# Patient Record
Sex: Female | Born: 1970 | Race: Black or African American | Hispanic: No | Smoking: Former smoker
Health system: Southern US, Community
[De-identification: ages and names within clinical notes are randomized; demographics above are authoritative.]

## PROBLEM LIST (undated history)

## (undated) VITALS — BP 112/76 | HR 96 | Temp 98.0°F | Resp 16 | Ht 59.0 in | Wt 168.0 lb

## (undated) DIAGNOSIS — D649 Anemia, unspecified: Secondary | ICD-10-CM

## (undated) DIAGNOSIS — F419 Anxiety disorder, unspecified: Secondary | ICD-10-CM

## (undated) DIAGNOSIS — F4310 Post-traumatic stress disorder, unspecified: Secondary | ICD-10-CM

## (undated) DIAGNOSIS — I639 Cerebral infarction, unspecified: Secondary | ICD-10-CM

## (undated) DIAGNOSIS — F329 Major depressive disorder, single episode, unspecified: Secondary | ICD-10-CM

## (undated) DIAGNOSIS — I10 Essential (primary) hypertension: Secondary | ICD-10-CM

## (undated) DIAGNOSIS — F32A Depression, unspecified: Secondary | ICD-10-CM

## (undated) HISTORY — DX: Anemia, unspecified: D64.9

## (undated) HISTORY — PX: DILATION AND CURETTAGE OF UTERUS: SHX78

## (undated) HISTORY — PX: KNEE SURGERY: SHX244

## (undated) HISTORY — PX: WISDOM TOOTH EXTRACTION: SHX21

---
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
## 1998-05-06 ENCOUNTER — Other Ambulatory Visit: Admission: RE | Admit: 1998-05-06 | Discharge: 1998-05-06 | Payer: Self-pay | Admitting: *Deleted

## 1998-06-03 ENCOUNTER — Ambulatory Visit (HOSPITAL_COMMUNITY): Admission: RE | Admit: 1998-06-03 | Discharge: 1998-06-03 | Payer: Self-pay | Admitting: *Deleted

## 1998-06-30 ENCOUNTER — Inpatient Hospital Stay (HOSPITAL_COMMUNITY): Admission: AD | Admit: 1998-06-30 | Discharge: 1998-06-30 | Payer: Self-pay | Admitting: *Deleted

## 1998-09-27 ENCOUNTER — Inpatient Hospital Stay (HOSPITAL_COMMUNITY): Admission: AD | Admit: 1998-09-27 | Discharge: 1998-09-27 | Payer: Self-pay | Admitting: Obstetrics

## 1998-11-07 ENCOUNTER — Inpatient Hospital Stay (HOSPITAL_COMMUNITY): Admission: AD | Admit: 1998-11-07 | Discharge: 1998-11-07 | Payer: Self-pay | Admitting: *Deleted

## 1998-11-08 ENCOUNTER — Encounter: Payer: Self-pay | Admitting: *Deleted

## 1998-11-08 ENCOUNTER — Ambulatory Visit (HOSPITAL_COMMUNITY): Admission: RE | Admit: 1998-11-08 | Discharge: 1998-11-08 | Payer: Self-pay | Admitting: *Deleted

## 1998-11-13 ENCOUNTER — Inpatient Hospital Stay (HOSPITAL_COMMUNITY): Admission: AD | Admit: 1998-11-13 | Discharge: 1998-11-15 | Payer: Self-pay | Admitting: *Deleted

## 1998-12-15 ENCOUNTER — Emergency Department (HOSPITAL_COMMUNITY): Admission: EM | Admit: 1998-12-15 | Discharge: 1998-12-15 | Payer: Self-pay | Admitting: Emergency Medicine

## 1999-03-29 ENCOUNTER — Emergency Department (HOSPITAL_COMMUNITY): Admission: EM | Admit: 1999-03-29 | Discharge: 1999-03-29 | Payer: Self-pay | Admitting: Emergency Medicine

## 1999-10-21 ENCOUNTER — Inpatient Hospital Stay (HOSPITAL_COMMUNITY): Admission: AD | Admit: 1999-10-21 | Discharge: 1999-10-21 | Payer: Self-pay | Admitting: *Deleted

## 2000-08-08 ENCOUNTER — Inpatient Hospital Stay (HOSPITAL_COMMUNITY): Admission: AD | Admit: 2000-08-08 | Discharge: 2000-08-08 | Payer: Self-pay | Admitting: *Deleted

## 2001-10-08 ENCOUNTER — Emergency Department (HOSPITAL_COMMUNITY): Admission: EM | Admit: 2001-10-08 | Discharge: 2001-10-08 | Payer: Self-pay | Admitting: Emergency Medicine

## 2002-01-24 ENCOUNTER — Emergency Department (HOSPITAL_COMMUNITY): Admission: EM | Admit: 2002-01-24 | Discharge: 2002-01-24 | Payer: Self-pay

## 2002-12-15 ENCOUNTER — Emergency Department (HOSPITAL_COMMUNITY): Admission: EM | Admit: 2002-12-15 | Discharge: 2002-12-15 | Payer: Self-pay | Admitting: Emergency Medicine

## 2002-12-26 ENCOUNTER — Inpatient Hospital Stay (HOSPITAL_COMMUNITY): Admission: AD | Admit: 2002-12-26 | Discharge: 2002-12-26 | Payer: Self-pay | Admitting: Obstetrics

## 2003-01-17 ENCOUNTER — Other Ambulatory Visit: Admission: RE | Admit: 2003-01-17 | Discharge: 2003-01-17 | Payer: Self-pay | Admitting: *Deleted

## 2003-04-11 ENCOUNTER — Inpatient Hospital Stay (HOSPITAL_COMMUNITY): Admission: AD | Admit: 2003-04-11 | Discharge: 2003-04-13 | Payer: Self-pay | Admitting: *Deleted

## 2003-04-12 ENCOUNTER — Encounter (INDEPENDENT_AMBULATORY_CARE_PROVIDER_SITE_OTHER): Payer: Self-pay

## 2003-06-07 ENCOUNTER — Ambulatory Visit (HOSPITAL_COMMUNITY): Admission: RE | Admit: 2003-06-07 | Discharge: 2003-06-07 | Payer: Self-pay | Admitting: Urology

## 2003-06-07 ENCOUNTER — Encounter: Payer: Self-pay | Admitting: Urology

## 2003-07-20 ENCOUNTER — Ambulatory Visit (HOSPITAL_BASED_OUTPATIENT_CLINIC_OR_DEPARTMENT_OTHER): Admission: RE | Admit: 2003-07-20 | Discharge: 2003-07-20 | Payer: Self-pay | Admitting: Urology

## 2003-07-20 ENCOUNTER — Encounter (INDEPENDENT_AMBULATORY_CARE_PROVIDER_SITE_OTHER): Payer: Self-pay | Admitting: Specialist

## 2003-08-18 ENCOUNTER — Emergency Department (HOSPITAL_COMMUNITY): Admission: EM | Admit: 2003-08-18 | Discharge: 2003-08-18 | Payer: Self-pay

## 2004-02-01 ENCOUNTER — Emergency Department (HOSPITAL_COMMUNITY): Admission: EM | Admit: 2004-02-01 | Discharge: 2004-02-01 | Payer: Self-pay | Admitting: Emergency Medicine

## 2004-02-12 ENCOUNTER — Inpatient Hospital Stay (HOSPITAL_COMMUNITY): Admission: AD | Admit: 2004-02-12 | Discharge: 2004-02-12 | Payer: Self-pay | Admitting: *Deleted

## 2004-02-15 ENCOUNTER — Other Ambulatory Visit: Admission: RE | Admit: 2004-02-15 | Discharge: 2004-02-15 | Payer: Self-pay | Admitting: Obstetrics and Gynecology

## 2004-06-25 ENCOUNTER — Inpatient Hospital Stay (HOSPITAL_COMMUNITY): Admission: AD | Admit: 2004-06-25 | Discharge: 2004-06-25 | Payer: Self-pay | Admitting: Obstetrics and Gynecology

## 2004-07-19 ENCOUNTER — Inpatient Hospital Stay (HOSPITAL_COMMUNITY): Admission: AD | Admit: 2004-07-19 | Discharge: 2004-07-19 | Payer: Self-pay | Admitting: Obstetrics and Gynecology

## 2004-08-03 ENCOUNTER — Inpatient Hospital Stay (HOSPITAL_COMMUNITY): Admission: AD | Admit: 2004-08-03 | Discharge: 2004-08-05 | Payer: Self-pay | Admitting: Obstetrics and Gynecology

## 2004-08-17 ENCOUNTER — Inpatient Hospital Stay (HOSPITAL_COMMUNITY): Admission: AD | Admit: 2004-08-17 | Discharge: 2004-08-17 | Payer: Self-pay | Admitting: Obstetrics & Gynecology

## 2004-09-01 ENCOUNTER — Inpatient Hospital Stay (HOSPITAL_COMMUNITY): Admission: AD | Admit: 2004-09-01 | Discharge: 2004-09-01 | Payer: Self-pay | Admitting: Obstetrics & Gynecology

## 2004-09-04 ENCOUNTER — Observation Stay (HOSPITAL_COMMUNITY): Admission: AD | Admit: 2004-09-04 | Discharge: 2004-09-05 | Payer: Self-pay | Admitting: Obstetrics & Gynecology

## 2004-09-08 ENCOUNTER — Inpatient Hospital Stay (HOSPITAL_COMMUNITY): Admission: AD | Admit: 2004-09-08 | Discharge: 2004-09-12 | Payer: Self-pay | Admitting: Obstetrics

## 2004-09-16 ENCOUNTER — Inpatient Hospital Stay (HOSPITAL_COMMUNITY): Admission: AD | Admit: 2004-09-16 | Discharge: 2004-09-20 | Payer: Self-pay | Admitting: Obstetrics

## 2004-09-22 ENCOUNTER — Inpatient Hospital Stay (HOSPITAL_COMMUNITY): Admission: EM | Admit: 2004-09-22 | Discharge: 2004-09-26 | Payer: Self-pay | Admitting: Emergency Medicine

## 2004-09-22 ENCOUNTER — Ambulatory Visit: Payer: Self-pay | Admitting: Pulmonary Disease

## 2004-09-24 ENCOUNTER — Encounter (INDEPENDENT_AMBULATORY_CARE_PROVIDER_SITE_OTHER): Payer: Self-pay | Admitting: *Deleted

## 2004-09-24 ENCOUNTER — Encounter: Payer: Self-pay | Admitting: Cardiology

## 2004-10-23 ENCOUNTER — Ambulatory Visit: Payer: Self-pay | Admitting: Internal Medicine

## 2004-12-15 ENCOUNTER — Emergency Department (HOSPITAL_COMMUNITY): Admission: EM | Admit: 2004-12-15 | Discharge: 2004-12-15 | Payer: Self-pay | Admitting: Emergency Medicine

## 2005-03-15 ENCOUNTER — Emergency Department (HOSPITAL_COMMUNITY): Admission: EM | Admit: 2005-03-15 | Discharge: 2005-03-15 | Payer: Self-pay | Admitting: Emergency Medicine

## 2005-03-25 IMAGING — CR DG CHEST 2V
2 series · 2 of 2 positions shown · non-contrast
Comparison: none

CLINICAL DATA: Shortness of breath.  Mastitis.  
 PA AND LATERAL CHEST, 09/17/04
 There is cardiomegaly.  There is a consolidative infiltrate or focal pulmonary edema at the right lung base.  There is no discrete effusion.  
 There is a mild thoracic scoliosis.  
 IMPRESSION
 There is an infiltrate at the right base, most likely pneumonia.  However, the patient does have borderline cardiomegaly but focal pulmonary edema could give a similar appearance although I think this is less likely.

[view not recorded (1 of 2)]
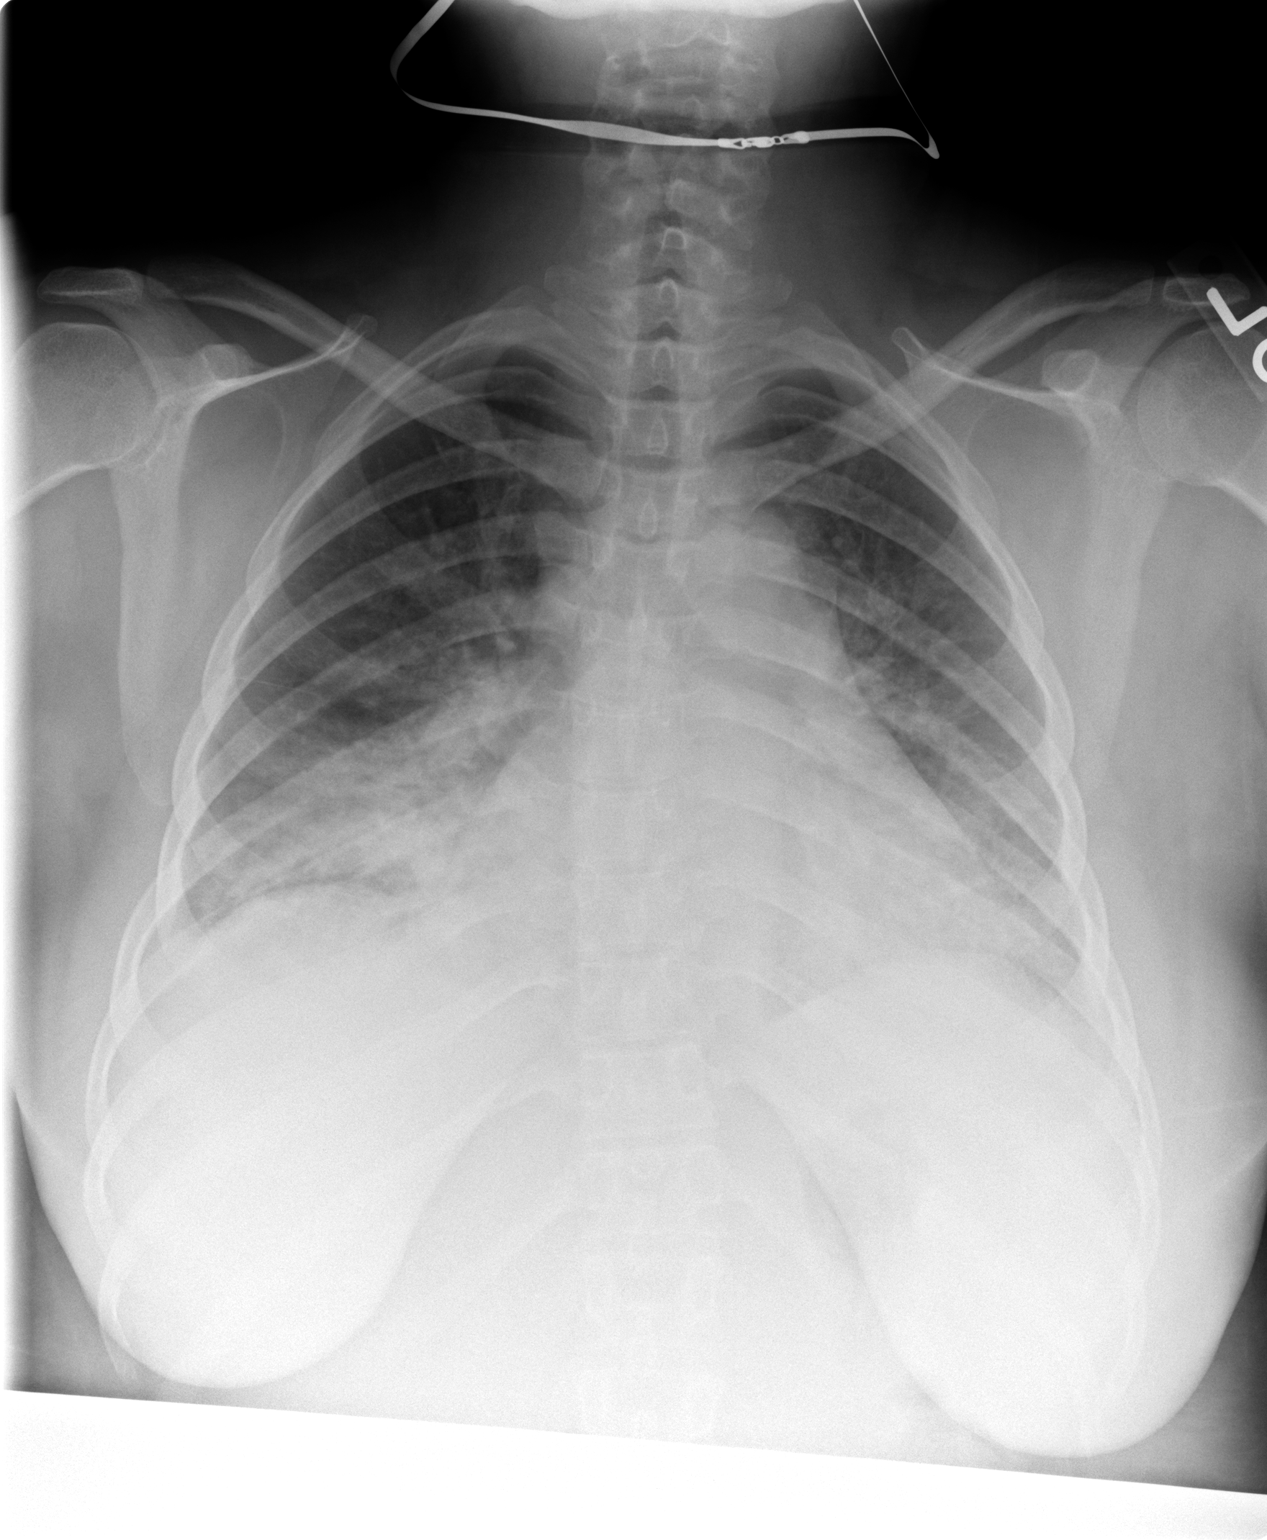

[view not recorded (2 of 2)]
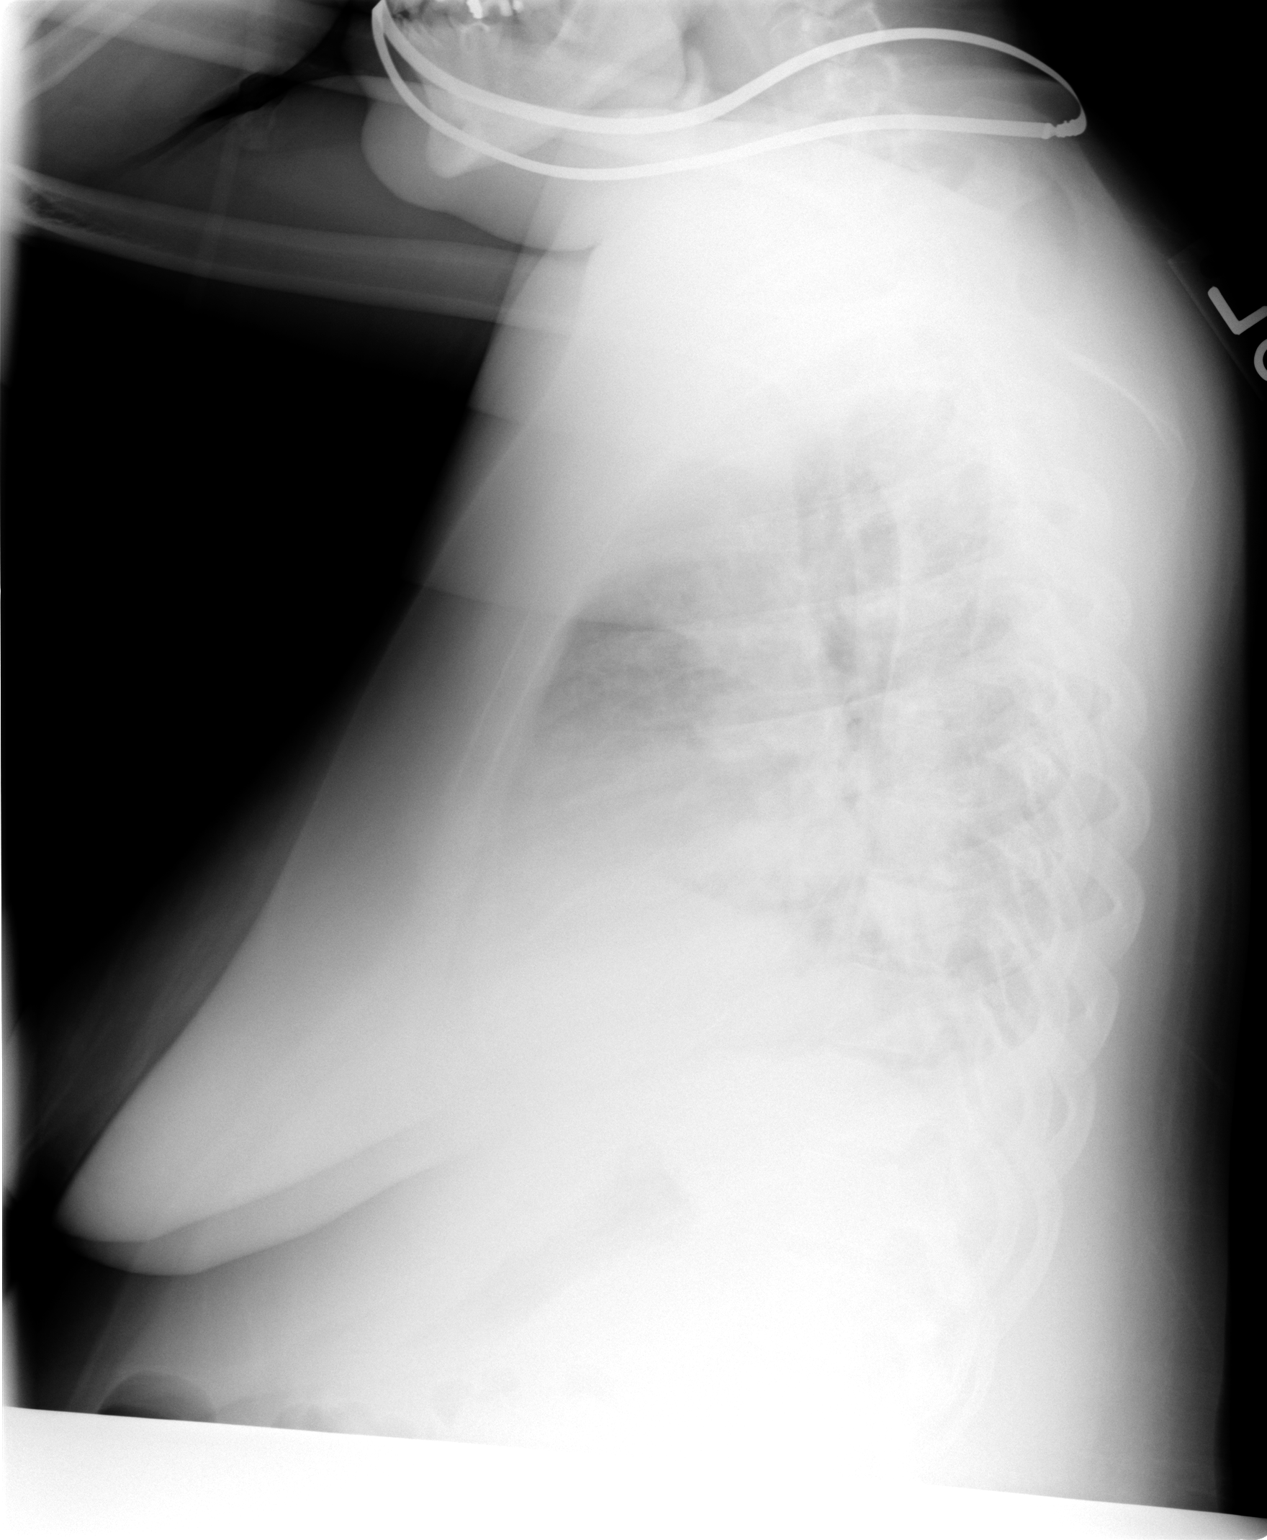

[2 of 2 positions shown; findings below may reference images not displayed]

## 2005-03-27 IMAGING — CR DG CHEST 2V
2 series · 2 of 2 positions shown · non-contrast
Comparison: 09/17/04.

CLINICAL DATA: Follow-up pulmonary edema.
 CHEST TWO VIEWS

[view not recorded (1 of 2)]
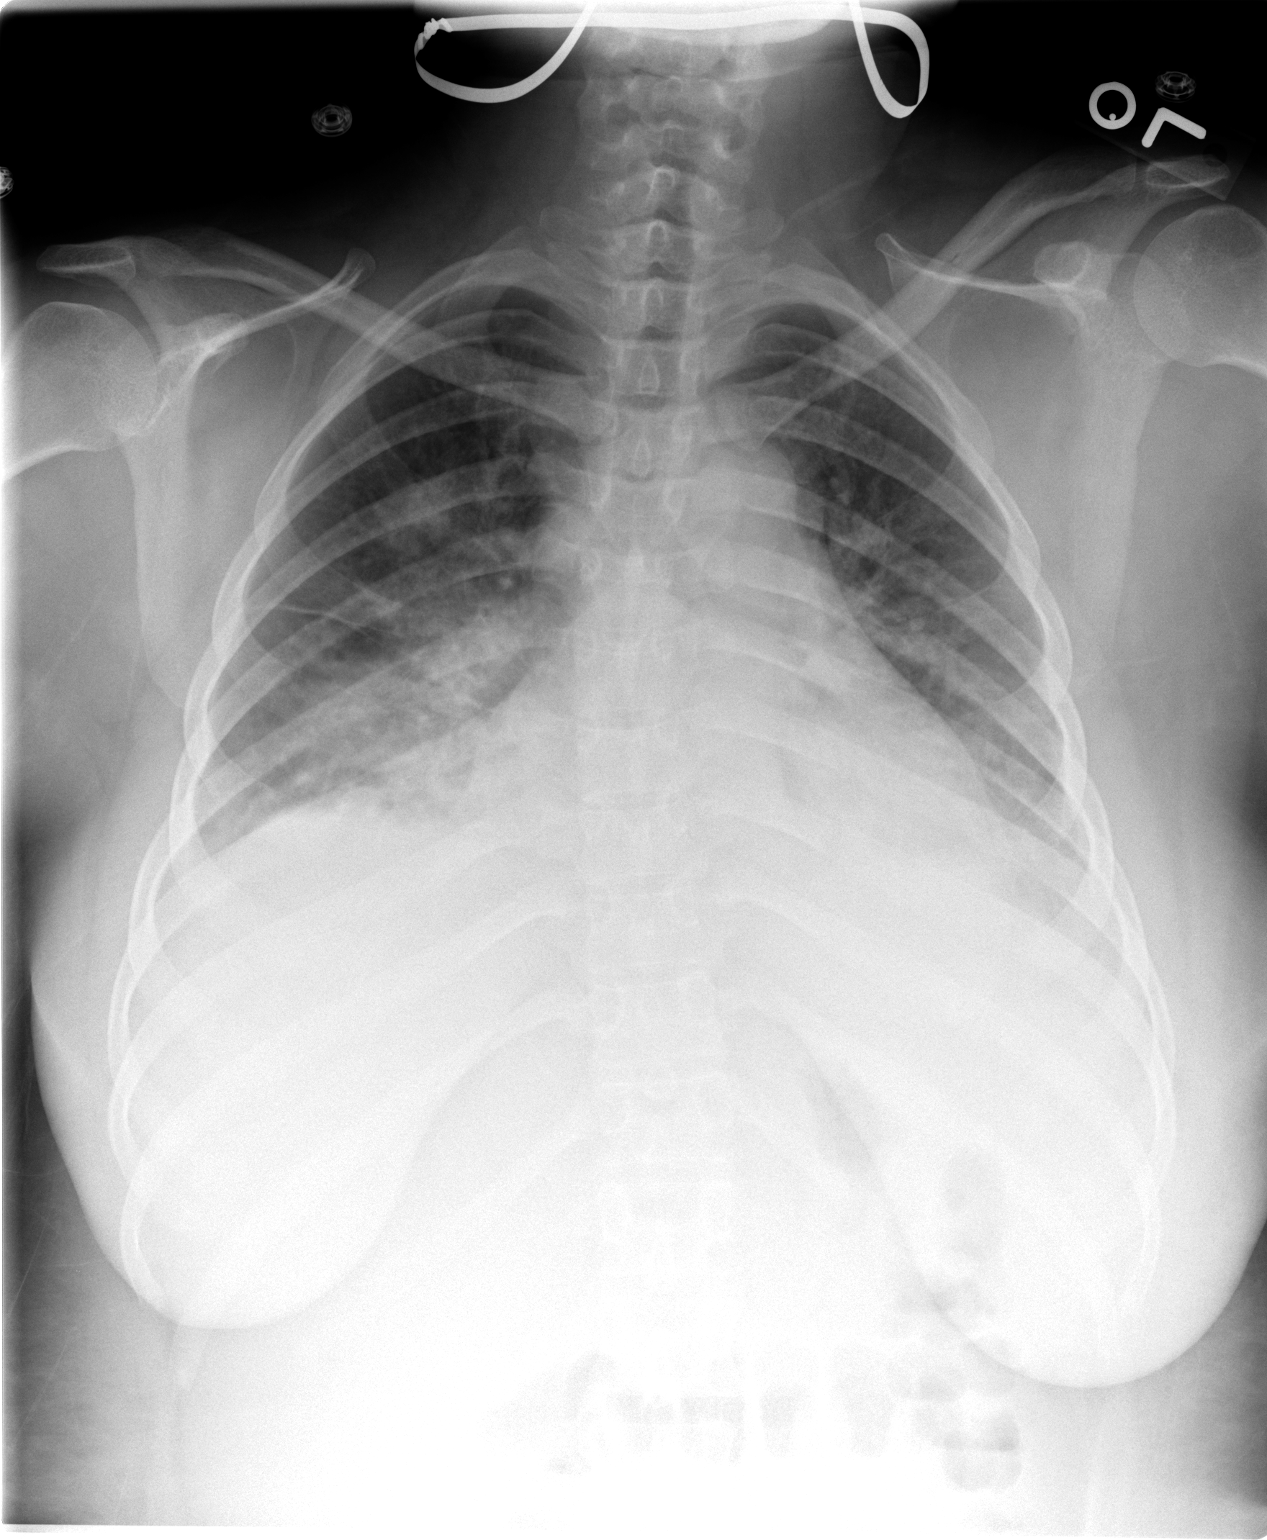

[view not recorded (2 of 2)]
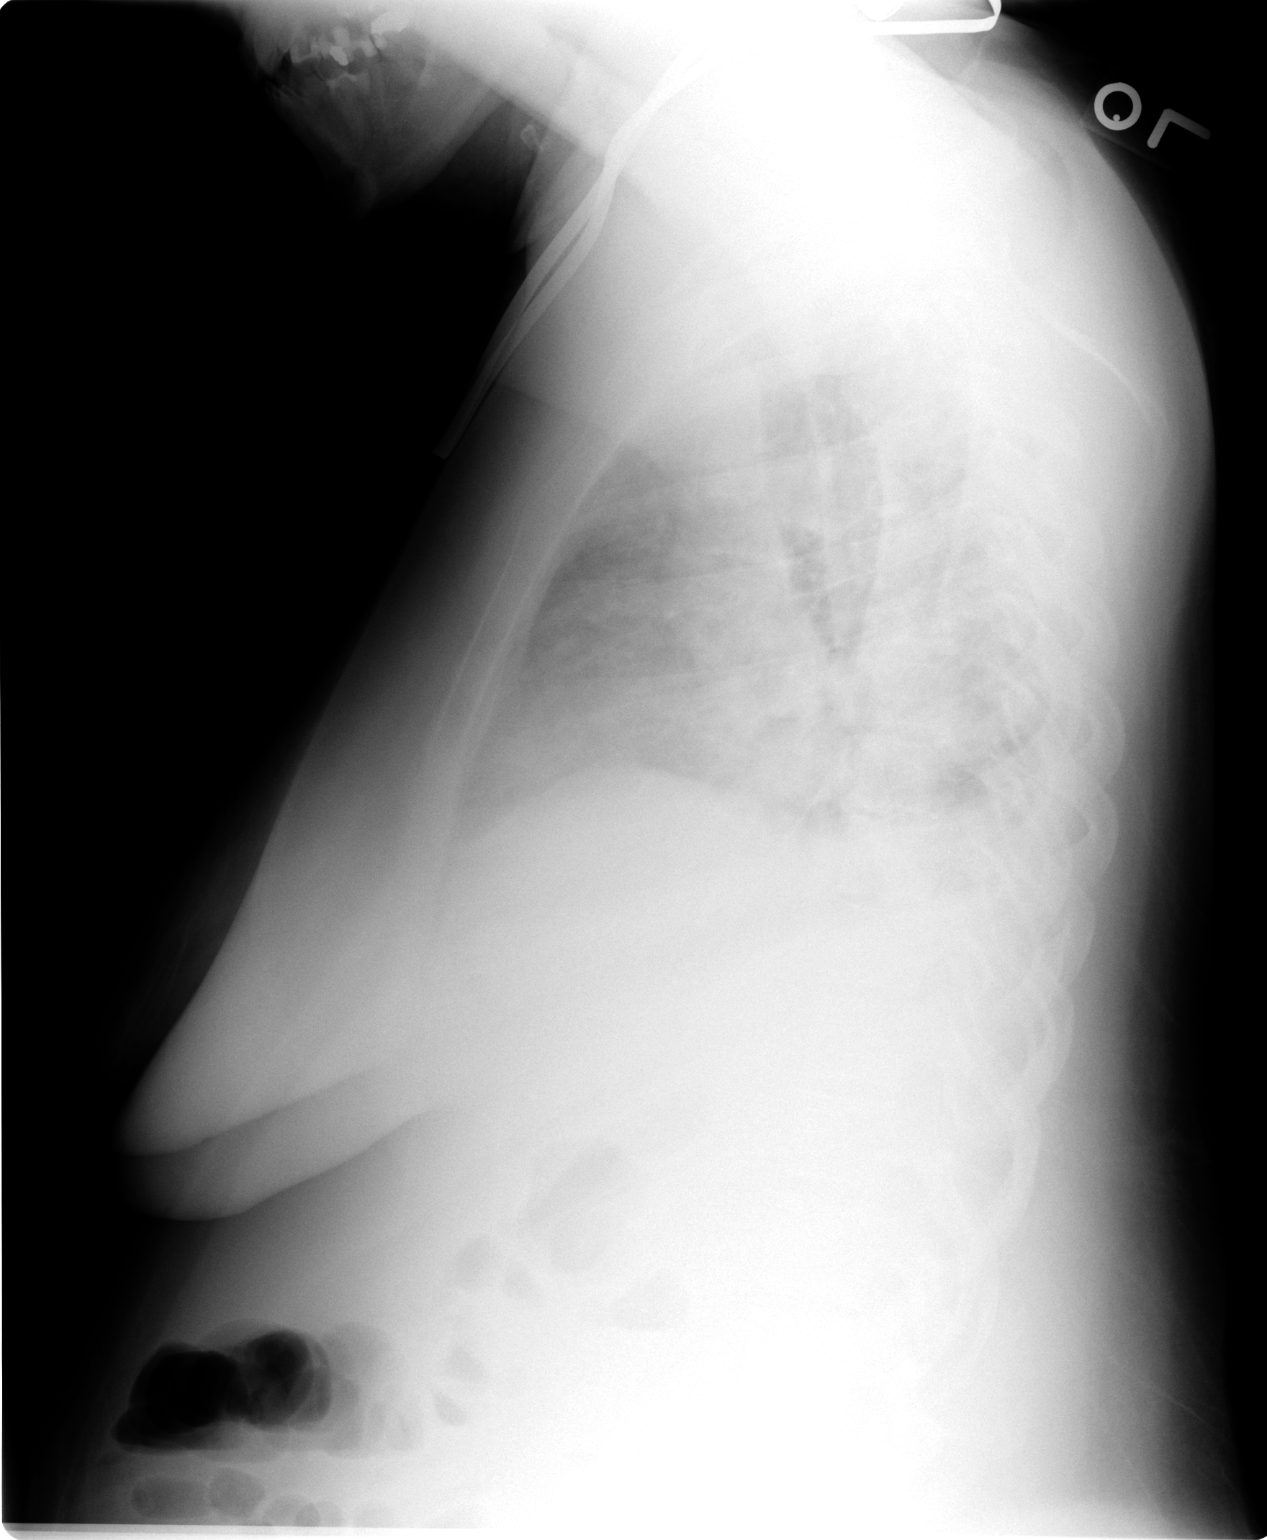

[2 of 2 positions shown; findings below may reference images not displayed]

The patient continues to have bilateral air space density within the lungs primarily affecting the lower lobes but with some involvement of the upper lobes.  When compared to the films of two days ago, there is little change.  There is a tiny amount of pleural fluid.  No new abnormalities are seen.
IMPRESSION: Little radiographic change in bilateral air space density that could be due to pneumonia or edema.

## 2006-08-25 ENCOUNTER — Emergency Department (HOSPITAL_COMMUNITY): Admission: EM | Admit: 2006-08-25 | Discharge: 2006-08-25 | Payer: Self-pay | Admitting: Emergency Medicine

## 2007-07-18 ENCOUNTER — Encounter: Admission: RE | Admit: 2007-07-18 | Discharge: 2007-07-18 | Payer: Self-pay | Admitting: Obstetrics & Gynecology

## 2008-01-20 ENCOUNTER — Emergency Department (HOSPITAL_COMMUNITY): Admission: EM | Admit: 2008-01-20 | Discharge: 2008-01-20 | Payer: Self-pay | Admitting: Emergency Medicine

## 2008-11-07 ENCOUNTER — Emergency Department (HOSPITAL_COMMUNITY): Admission: EM | Admit: 2008-11-07 | Discharge: 2008-11-07 | Payer: Self-pay | Admitting: Emergency Medicine

## 2009-04-09 ENCOUNTER — Emergency Department (HOSPITAL_COMMUNITY): Admission: EM | Admit: 2009-04-09 | Discharge: 2009-04-09 | Payer: Self-pay | Admitting: Emergency Medicine

## 2010-12-14 ENCOUNTER — Encounter: Payer: Self-pay | Admitting: Obstetrics & Gynecology

## 2011-04-10 NOTE — Op Note (Signed)
[name], [name]           ACCOUNT NO.:  [id number]   MEDICAL RECORD NO.:  [id number]          PATIENT TYPE:  INP   LOCATION:  0476                         FACILITY:  [facility]   PHYSICIAN:  Sharlet Salina T. Hoxworth, M.D.DATE OF BIRTH:  01/15/1971   DATE OF PROCEDURE:  09/23/2004  DATE OF DISCHARGE:                                 OPERATIVE REPORT   PREOPERATIVE DIAGNOSES:  Cholelithiasis and cholecystitis.   POSTOPERATIVE DIAGNOSES:  Cholelithiasis and cholecystitis.   SURGICAL PROCEDURE:  Laparoscopic cholecystectomy with intraoperative  cholangiogram.   SURGEON:  Dr. Johna Sheriff   ANESTHESIA:  General.   BRIEF HISTORY:  [name] is a 40 year old black female,  approximately 2 weeks postpartum.  She has known gallstones discovered  during pregnancy.  She presents with persistent flank and back pain, nausea,  and significant tenderness in the right upper quadrant.  She has mildly  elevated LFTs.  She is felt to likely have ongoing cholecystitis, and  laparoscopic cholecystectomy with cholangiogram has been recommended and  accepted.  The nature of the procedure, indications, risks of bleeding,  infection, bile leak, and bile duct injury were discussed and understood.  She is now brought to the operating room for this procedure.   DESCRIPTION OF OPERATION:  The patient brought to the operating room and  placed in the supine position on the operating table, and general  endotracheal anesthesia was induced.  She was already on preoperative  antibiotics.  PAS were in place.  The abdomen was widely sterilely prepped  and draped.  Local anesthesia was used to infiltrate the trocar sites prior  to the incisions.  A 1 cm incision was made at the umbilicus and dissection  carried down to the midline fascia which was sharply incised for 1 cm and  the peritoneum entered under direct vision.  Through a mattress suture of 0  Vicryl, the Hasson trocar was placed and pneumoperitoneum  established.  Under direct vision, a 10 mm trocar was placed in the subxiphoid area and  two 5 mm trocars were on the right subcostal margin.  The gallbladder was  thickened and edematous with some degree of acute inflammation as well.  The  fundus was grasped and elevated up over the liver and the infundibulum  retracted inferolaterally.  Peritoneum anterior and posterior to Calot's  triangle was incised.  The peritoneum was quite thickened.  Fibrofatty  tissue was stripped off the neck of the gallbladder toward the porta  hepatis.  The cystic artery was identified coursing up on the gallbladder  wall and was divided between two proximal and one distal clip.  The cystic  duct gallbladder junction was dissected 360 degrees and the cystic duct  dissected out over about 1 cm.  The cystic duct was then clamped at the  gallbladder junction and operative cholangiogram obtained through the cystic  duct.  This showed good filling of a normal size common bile duct and  intrahepatic ducts with free flow into the duodenum and no filling defects.  Following this, the Cholangiocath was removed, and the cystic duct was  triply clipped  proximally and divided.  The  gallbladder was then dissected  free from its bed using hook cautery and removed through the umbilicus.  The  right upper quadrant was thoroughly irrigated and suctioned until clear and  complete hemostasis obtained in the gallbladder bed.  Trocars were removed  under direct vision.  All CO2 evacuated from the peritoneal cavity.  The  mattress suture was secured  at the umbilicus.  Skin incisions were closed with interrupted subcuticular  4-0 Monocryl and Steri-Strips.  Sponge, needle, and instrument counts were  correct.  Dry sterile dressings were applied and the patient taken to  recovery in good condition.      BTH/MEDQ  D:  09/23/2004  T:  09/23/2004  Job:  [id number]
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
## 2011-04-10 NOTE — Discharge Summary (Signed)
[name], [name]           ACCOUNT NO.:  [id number]   MEDICAL RECORD NO.:  [id number]          PATIENT TYPE:  INP   LOCATION:  9175                          [facility]ACILITY:  [facility]   PHYSICIAN:  James A. [name], M.D.DATE OF BIRTH:  [birth year]   DATE OF ADMISSION:  08/03/2004  DATE OF DISCHARGE:  08/05/2004                                 DISCHARGE SUMMARY   DISCHARGE DIAGNOSES:  1.  Intrauterine pregnancy at [redacted] weeks gestation.  2.  Cholelithiasis.  3.  Preterm labor versus irritability - lysed.  4.  Elevated liver enzymes - probably secondary to #2, improving.   CONSULTATIONS:  None.   OPERATIONS AND SPECIAL PROCEDURES:  None.   DISCHARGE MEDICATIONS:  1.  Terbutaline 2.5 mg q.6h.  2.  Percocet one q.4h. p.r.n.   HISTORY AND PHYSICAL:  This is a 40 year old gravida 5 para 3 AB 1 at [redacted]  weeks gestation at the time of presentation.  She presented to maternity  admissions at [facility] complaining of abdominal discomfort and  pruritus.  She was evaluated by Dr. Lily Peer.  Initial diagnosis was  intrahepatic cholestasis.  For the remainder of the history and physical  please see chart.   HOSPITAL COURSE:  The patient was admitted to [facility]  [place].  She was given p.o. terbutaline.  Admission laboratory studies  were drawn.  Amylase and lipase returned as normal.  Biophysical profile was  obtained and the value was 8 out of a possible 8.  The SGOT and SGPT were  elevated at 157 and 240 respectively.  On August 04, 2004 an ultrasound  was ordered which revealed a single gallstone without any biliary tract  dilatation or apparent obstruction.  The SGOT diminished to 90, then 49.  The SGPT diminished to 192, then 130.   At this point the patient was felt to have an adequate diagnosis, and with  improvement of the liver enzymes she was felt to be a stable candidate for  discharge.  Hence, she was discharged home August 05, 2004 afebrile and  in  satisfactory condition.   DISPOSITION:  The patient is to return to [facility]  in several days for continued antenatal care.     JAM/MEDQ  D:  08/20/2004  T:  08/21/2004  Job:  [id number]
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
## 2011-04-10 NOTE — Discharge Summary (Signed)
[name], [name]           ACCOUNT NO.:  [id number]   MEDICAL RECORD NO.:  [id number]          PATIENT TYPE:  INP   LOCATION:  9317                          [facility]ACILITY:  [facility]   PHYSICIAN:  Charles A. Clearance Coots, M.D.DATE OF BIRTH:  Aug 24, 1971   DATE OF ADMISSION:  09/16/2004  DATE OF DISCHARGE:                                 DISCHARGE SUMMARY   ADMITTING DIAGNOSIS:  Bilateral mastitis.   DISCHARGE DIAGNOSIS:  Bilateral mastitis, improved after intravenous  antibiotic therapy.   REASON FOR ADMISSION:  A 40 year old black female G5 P4 status post normal  spontaneous vaginal delivery on September 10, 2004 presented to triage at  [facility] with a complaint of right shoulder pain and pain under both  arms and breasts.  The patient has had some difficulty with breastfeeding at  home and noticed increased breast tenderness.  She thought she also felt  some fever at home.   PAST MEDICAL HISTORY:  Surgery:  Leg surgery, knee surgery, and wisdom  teeth.  Illnesses: Gallstones, anemia.   MEDICATIONS:  Prenatal vitamins, iron, ibuprofen.   ALLERGIES:  No known drug allergies.   SOCIAL HISTORY:  Negative for tobacco, alcohol, or recreational drug use.   FAMILY HISTORY:  Noncontributory.   PHYSICAL EXAMINATION:  GENERAL:  Well-nourished, well-developed black female  in no acute distress.  VITAL SIGNS:  Temperature 98.7, pulse 76, respiratory rate 18, blood  pressure 139/81.  HEENT:  Normal.  NECK:  Supple without adenopathy.  LUNGS:  Clear to auscultation bilaterally.  HEART:  Regular rate and rhythm.  BREASTS:  Tender bilaterally, warm, and erythema and some swelling in both  axillary areas, with positive axillary or either breast ductal swelling.  ABDOMEN:  Soft, nontender.  PELVIC:  Omitted.  EXTREMITIES:  With 1-2+ pedal edema.   IMPRESSION:  Bilateral mastitis.   PLAN:  Admit, IV antibiotic treatment, and lactation consultation.   LABORATORY VALUES:  Urinalysis  within normal limits.  Comprehensive  metabolic panel within normal limits.  Hemoglobin 9.8, hematocrit 29.8.   HOSPITAL COURSE:  The patient was admitted and started on IV Unasyn,  responded quite well to Unasyn therapy after 48 hours.  Breasts were softer  and less tender after 48 hours and she had been afebrile throughout the  whole course of hospital admission.  On hospital day #2, the patient  complained of some shortness of breath.  However, the lungs were clear to  auscultation initially and chest x-ray was obtained which revealed some  cardiomegaly and findings consistent with pneumonia being a right lower lobe  infiltrate.  Azithromycin was added to the antibiotic regimen and the  patient responded well.  She developed some decreased breath sounds in both  lung bases over the next 24 hours, however, and developed some lower  extremity edema.  Pulmonary medicine consultation was obtained and  recommendations were made.  The patient was much improved by hospital day  #4, and was discharged home on hospital day #4 much improved.   DISCHARGE LABORATORY VALUES:  Comprehensive metabolic panel was within  normal limits.  Hemoglobin 10.4, hematocrit 31.4.  Erythrocyte sedimentation  rate  was elevated at 54.  CK-MB and troponin I labs were within normal  limits with indication of myocardial injury.  EKG was within normal limits  except for some sinus tachycardia and nonspecific T wave changes.   DISCHARGE DISPOSITION:  1.  Medications:  Continue prenatal vitamins, Ceftin 500 mg twice a day.  2.  The patient is to follow up in the office in 2 weeks.     Char   CAH/MEDQ  D:  09/20/2004  T:  09/20/2004  Job:  [id number]

## 2011-04-10 NOTE — H&P (Signed)
[name], [name]                     ACCOUNT NO.:  [id number]   MEDICAL RECORD NO.:  [id number]                   PATIENT TYPE:  INP   LOCATION:  9175                                 FACILITY:  [facility]   PHYSICIAN:  Juan H. Lily Peer, M.D.             DATE OF BIRTH:  08-18-1971   DATE OF ADMISSION:  08/03/2004  DATE OF DISCHARGE:                                HISTORY & PHYSICAL   ADDENDUM:  She is a 40 year old, gravida 5, para 3, AB1, currently 32 weeks'  gestation who was being evaluated in the emergency room for pruritus and  abdominal discomfort.  She had received a shot of subcutaneous terbutaline  0.25 mg at [facility] where she had presented earlier and  transferred to [facility].  She was given IV fluids of lactated  Ringer's.  She appeared to have uterine irritability, and her abdominal  discomfort improved.  When I came back to see her, she was having a  significant amount of abdominal discomfort.  When the belt was repositioned,  she had uterine irritability interchanged with some mild contractions.  Perhaps all the symptoms that she has been experiencing, which she states  has been going on for several weeks, could be underlying premature  contractions.  For this reason, we will admit her to the hospital and keep  her on terbutaline 2.5 mg p.o. q.4 h. and interestingly, the report from her  comprehensive metabolic panel had demonstrated normal electrolytes, but her  SGOT and SGPT were elevated (SGOT was 157, and SGPT was elevated at 240).  Alkaline phosphatase 143, but her total bilirubin was normal at 0.6.  Her  CBC demonstrated a white blood cell count of 9.8, hemoglobin 10.1,  hematocrit 30.3, with a platelet count of 307,000.  She did have an  ultrasound with size consistent with her dates, no abnormalities, and the  placenta detected.  Normal AFI, cervical length 3.4 cm.  GC and Chlamydia  culture and group B Strep are pending at the time of this  dictation.   WORKING DIAGNOSIS:  The possibility of benign intrahepatic cholestasis of  pregnancy.  With her presenting symptoms, there is doubt that this could be  fatty liver pregnancy.  Her blood sugar was normal at 107, and no other  abnormality noted, and no evidence of HELLP syndrome.  Her blood pressure  108/68, pulse 117, respiration 12.   PLAN:  Will go ahead and admit her and observe her on a monitor throughout  the night. Will keep on terbutaline 2.5 mg p.o. q.4 h.  I am currently  checking a serum lipase and amylase.  Will go ahead and repeat same  laboratory tests of CBC, CMET, lipase, and amylase in the morning.  Will  give her Benadryl 25 mg p.o. b.i.d. for her pruritus and calamine or  Benadryl lotion to apply topical p.r.n.  Of note, the patient's discomfort  is more lower abdominal than upper  abdominal region, and there was no  rebound or guarding, and her previous pregnancies were all delivered  vaginally.  Beside the above-mentioned tests, we will also run an acute  hepatitis panel as well.  All of these issues were discussed with the  patient, and we will monitor closely overnight along with her laboratory  tests as well.  As a last resort, if her pruritus becomes more significant,  there is a possibility she could be placed on Actigall 500 mg b.i.d. which  is a Category B medication, but we will wait for her clinical course over  the next 24-48 hours.  Of note overall, the fetal heart rate tracing has  been re-assuring as well.                                               Juan H. Lily Peer, M.D.    JHF/MEDQ  D:  08/03/2004  T:  08/03/2004  Job:  [id number]
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
## 2011-04-10 NOTE — Discharge Summary (Signed)
[name], [name]           ACCOUNT NO.:  [id number]   MEDICAL RECORD NO.:  [id number]          PATIENT TYPE:  INP   LOCATION:  0476                         FACILITY:  [facility]   PHYSICIAN:  Sharlet Salina T. Hoxworth, M.D.DATE OF BIRTH:  05-10-1971   DATE OF ADMISSION:  09/22/2004  DATE OF DISCHARGE:  09/26/2004                                 DISCHARGE SUMMARY   DISCHARGE DIAGNOSES:  1.  Cholelithiasis and cholecystitis.  2.  Hypoxia and pulmonary infiltrates, question ruptured capillary syndrome      postpartum.   HISTORY OF PRESENT ILLNESS:  This patient is a 40 year old black female 12  days postpartum from a vaginal delivery   Dictation ended at this point.      BTH/MEDQ  D:  10/07/2004  T:  10/08/2004  Job:  [id number]

## 2011-04-10 NOTE — Op Note (Signed)
   NAMEKATHLEEN, [name]                       ACCOUNT NO.:  [id number]   MEDICAL RECORD NO.:  [id number]                   PATIENT TYPE:  AMB   LOCATION:  NESC                                 [facility]   PHYSICIAN:  Lindaann Slough, M.D.               DATE OF BIRTH:  1971/07/16   DATE OF PROCEDURE:  07/20/2003  DATE OF DISCHARGE:                                 OPERATIVE REPORT   PREOPERATIVE DIAGNOSIS:  Suburethral cyst (Bartholin's gland cyst).   POSTOPERATIVE DIAGNOSIS:  Suburethral cyst (Bartholin's gland cyst).   PROCEDURE:  Excision of suburethral cyst.   SURGEON:  Lindaann Slough, M.D.   ANESTHESIA:  General.   INDICATIONS FOR PROCEDURE:  The patient is a 40 year old female who was  found by her gynecologist to have a large suburethral cyst. She was referred  to me for further evaluation. VCUG showed no urethral diverticulum. The  patient is scheduled for excision of the cyst.   DESCRIPTION OF PROCEDURE:  Under general anesthesia, the patient was prepped  and draped and placed in the dorsal lithotomy position. A #16 Foley catheter  was inserted in the bladder. The labia were approximated to the inner thighs  with #2-0 silk and then the vaginal mucosa was infiltrated with 0.5%  Marcaine with epinephrine. Then a longitudinal incision was made from about  1 cm from the urethral meatus and the incision was carried down to the  subcutaneous tissues. The cyst was then sharply dissected from the  surrounding tissues, care being taken not to injure the urethra. After  careful dissection, the cyst was excised. The urethra was intact. Then  hemostasis was secured with electrocautery and suture ligatures. The wound  was then irrigated with normal saline. The subcutaneous tissues were  approximated with 2-0 Vicryl and the vaginal mucosa was closed with #2-0  Vicryl using running sutures.   The patient tolerated the procedure well and left the OR in satisfactory  condition to post anesthesia care unit.                                               Lindaann Slough, M.D.    MN/MEDQ  D:  07/20/2003  T:  07/20/2003  Job:  [id number]   cc:   Georgina Peer, M.D.  [address]  [place]  [place] [address]  Fax: [phone]

## 2011-04-10 NOTE — Consult Note (Signed)
[name], [name]                     ACCOUNT NO.:  [id number]   MEDICAL RECORD NO.:  [id number]                   PATIENT TYPE:  INP   LOCATION:  9175                                 FACILITY:  [facility]   PHYSICIAN:  Juan H. Lily Peer, M.D.             DATE OF BIRTH:  1971-10-15   DATE OF CONSULTATION:  08/03/2004  DATE OF DISCHARGE:                                   CONSULTATION   CHIEF COMPLAINT:  Lower abdominal discomfort and pruritus.   HISTORY:  The patient is a 40 year old gravida 5, para 3, AB 1, currently at  62 weeks estimated gestational age who had presented to [facility]  in the early morning of September 11 complaining of some vague lower  discomfort and pruritus.  The emergency room had contacted me and informed  me that the patient appeared to be contracting every 10 minutes apart.  She  was started on IV fluids consisting of LR 500 mL bolus and verbally I gave  instructions to give her a shot of terbutaline 0.25 mg subcutaneous to  transfer to [facility].  She was transferred with IV fluids and a  Foley catheter.  Fetal heart tones had been appreciated.  The patient had  denied any vaginal discharge, no fever, no nausea or vomiting.  The patient  does have a history of in intrauterine fetal demise at approximately 5  months pregnant back in 2004 which was attributed to a cord accident.  The  patient was placed on the monitor here, had some uterine irritability.  Fetal heart rate tracing at 150-160 range.  Her abdomen was soft and  nontender and her pelvic exam demonstrated a cervix that was long, closed,  and posterior.   She underwent an ultrasound which demonstrated a viable fetus in the vertex  presentation with an AFI of 17.5 cm and an ultrasound size consistent with  last menstrual period and cervical length measurement was recorded at 3.4 cm  and placenta was a grade 2 and was intact.   PHYSICAL EXAMINATION:  VITAL SIGNS:  The patient's  blood pressure was  120/66, pulse 85, heart rate 124.  Approximately 3-1/2 hours ago she had had  the terbutaline.  Her temperature was 99.5.  LUNGS:  Clear to auscultation.  No rhonchi or wheezes.  HEART:  Heart rate at 110 to 120 beats per minute.  No murmurs or gallops.  ABDOMEN:  Soft, nontender, no rebound or guarding.  SKIN:  Slight rash but nothing of major significance noted on the anterior  abdominal wall or in the back or arms or lower extremities.  PELVIC:  Cervix was long, closed, and posterior.  EXTREMITIES:  No edema, no clonus.   The patient along with the ultrasound report mentioned above had a wet prep  that demonstrated moderate WBC but no yeast or Trichomonas or clue cells  reported.  A GC and chlamydia culture as well as a group B  Strep culture was  obtained and pending at the time of this dictation and urinalysis was  negative.  The patient was monitored for a few hours in the emergency room  at [facility] with some mild uterine irritability, reassuring fetal  heart rate tracings, heart rate in the 150s.  We will obtain a comprehensive  metabolic panel along with her CBC and if normal we will discharge home.  She has a follow-up appointment with Dr. Lillia Dallas on Wednesday  September 14 and will follow up on the cultures then.  She was instructed to  refrain from intercourse, strenuous activity, or ambulation.  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  She was instructed to do fetal movement kick counts at home as  well and in the event that she picks up any contractions, more than six an  hour, she will report to the emergency room for monitoring.   PLAN:  As per assessment above.                                               Juan H. Lily Peer, M.D.    JHF/MEDQ  D:  08/03/2004  T:  08/03/2004  Job:  [id number]   cc:   Fayrene Fearing A. [name], M.D.  [address]   [place], [place] [address]  Fax: [phone]

## 2011-07-07 ENCOUNTER — Emergency Department (HOSPITAL_COMMUNITY)
Admission: EM | Admit: 2011-07-07 | Discharge: 2011-07-07 | Disposition: A | Discharge status: Home / Self Care | Payer: Medicaid Other | Attending: Emergency Medicine | Admitting: Emergency Medicine

## 2011-07-07 DIAGNOSIS — L259 Unspecified contact dermatitis, unspecified cause: Secondary | ICD-10-CM | POA: Insufficient documentation

## 2011-07-07 DIAGNOSIS — I10 Essential (primary) hypertension: Secondary | ICD-10-CM | POA: Insufficient documentation

## 2011-08-11 ENCOUNTER — Emergency Department (HOSPITAL_COMMUNITY)
Admission: EM | Admit: 2011-08-11 | Discharge: 2011-08-12 | Disposition: A | Discharge status: Home / Self Care | Payer: Medicaid Other | Attending: Emergency Medicine | Admitting: Emergency Medicine

## 2011-08-11 DIAGNOSIS — F3289 Other specified depressive episodes: Secondary | ICD-10-CM | POA: Insufficient documentation

## 2011-08-11 DIAGNOSIS — F329 Major depressive disorder, single episode, unspecified: Secondary | ICD-10-CM | POA: Insufficient documentation

## 2011-08-11 LAB — DIFFERENTIAL
Basophils Absolute: 0 10*3/uL (ref 0.0–0.1)
Basophils Relative: 0 % (ref 0–1)
Eosinophils Absolute: 0.1 10*3/uL (ref 0.0–0.7)
Eosinophils Relative: 2 % (ref 0–5)
Lymphocytes Relative: 23 % (ref 12–46)
Lymphs Abs: 1 10*3/uL (ref 0.7–4.0)
Monocytes Absolute: 0.4 10*3/uL (ref 0.1–1.0)
Monocytes Relative: 8 % (ref 3–12)
Neutro Abs: 2.9 10*3/uL (ref 1.7–7.7)
Neutrophils Relative %: 67 % (ref 43–77)

## 2011-08-11 LAB — PREGNANCY, URINE: Preg Test, Ur: NEGATIVE

## 2011-08-11 LAB — CBC
HCT: 40.2 % (ref 36.0–46.0)
Hemoglobin: 13.3 g/dL (ref 12.0–15.0)
MCH: 28.9 pg (ref 26.0–34.0)
MCHC: 33.1 g/dL (ref 30.0–36.0)
MCV: 87.2 fL (ref 78.0–100.0)
Platelets: 276 10*3/uL (ref 150–400)
RBC: 4.61 MIL/uL (ref 3.87–5.11)
RDW: 12.8 % (ref 11.5–15.5)
WBC: 4.4 10*3/uL (ref 4.0–10.5)

## 2011-08-11 LAB — POCT I-STAT, CHEM 8
BUN: 15 mg/dL (ref 6–23)
Calcium, Ion: 1.19 mmol/L (ref 1.12–1.32)
Chloride: 103 mEq/L (ref 96–112)
Creatinine, Ser: 0.9 mg/dL (ref 0.50–1.10)
Glucose, Bld: 87 mg/dL (ref 70–99)
HCT: 43 % (ref 36.0–46.0)
Hemoglobin: 14.6 g/dL (ref 12.0–15.0)
Potassium: 4 mEq/L (ref 3.5–5.1)
Sodium: 139 mEq/L (ref 135–145)
TCO2: 24 mmol/L (ref 0–100)

## 2011-08-11 LAB — URINALYSIS, ROUTINE W REFLEX MICROSCOPIC
Bilirubin Urine: NEGATIVE
Glucose, UA: NEGATIVE mg/dL
Hgb urine dipstick: NEGATIVE
Ketones, ur: NEGATIVE mg/dL
Leukocytes, UA: NEGATIVE
Nitrite: NEGATIVE
Protein, ur: NEGATIVE mg/dL
Specific Gravity, Urine: 1.026 (ref 1.005–1.030)
Urobilinogen, UA: 1 mg/dL (ref 0.0–1.0)
pH: 6.5 (ref 5.0–8.0)

## 2011-08-11 LAB — RAPID URINE DRUG SCREEN, HOSP PERFORMED
Barbiturates: NOT DETECTED
Opiates: NOT DETECTED
Tetrahydrocannabinol: NOT DETECTED

## 2011-08-27 LAB — CBC
MCHC: 33.2 g/dL (ref 30.0–36.0)
MCV: 88.8 fL (ref 78.0–100.0)
Platelets: 304 10*3/uL (ref 150–400)

## 2011-08-27 LAB — DIFFERENTIAL
Basophils Relative: 0 % (ref 0–1)
Eosinophils Absolute: 0.1 10*3/uL (ref 0.0–0.7)
Neutrophils Relative %: 70 % (ref 43–77)

## 2011-08-27 LAB — URINALYSIS, ROUTINE W REFLEX MICROSCOPIC
Hgb urine dipstick: NEGATIVE
Nitrite: NEGATIVE
Specific Gravity, Urine: 1.035 — ABNORMAL HIGH (ref 1.005–1.030)
Urobilinogen, UA: 1 mg/dL (ref 0.0–1.0)

## 2011-08-27 LAB — POCT I-STAT, CHEM 8
Glucose, Bld: 82 mg/dL (ref 70–99)
HCT: 43 % (ref 36.0–46.0)
Hemoglobin: 14.6 g/dL (ref 12.0–15.0)
Potassium: 6.4 mEq/L (ref 3.5–5.1)
Sodium: 137 mEq/L (ref 135–145)
TCO2: 27 mmol/L (ref 0–100)

## 2011-08-27 LAB — POTASSIUM: Potassium: 3.6 mEq/L (ref 3.5–5.1)

## 2011-08-27 LAB — URINE MICROSCOPIC-ADD ON

## 2011-08-27 LAB — PREGNANCY, URINE: Preg Test, Ur: NEGATIVE

## 2011-10-29 ENCOUNTER — Emergency Department (HOSPITAL_COMMUNITY)
Admission: EM | Admit: 2011-10-29 | Discharge: 2011-10-30 | Disposition: A | Discharge status: Home / Self Care | Payer: Medicaid Other | Attending: Emergency Medicine | Admitting: Emergency Medicine

## 2011-10-29 DIAGNOSIS — R404 Transient alteration of awareness: Secondary | ICD-10-CM | POA: Insufficient documentation

## 2011-10-29 DIAGNOSIS — G479 Sleep disorder, unspecified: Secondary | ICD-10-CM | POA: Insufficient documentation

## 2011-10-29 DIAGNOSIS — F3289 Other specified depressive episodes: Secondary | ICD-10-CM | POA: Insufficient documentation

## 2011-10-29 DIAGNOSIS — F329 Major depressive disorder, single episode, unspecified: Secondary | ICD-10-CM

## 2011-10-29 HISTORY — DX: Depression, unspecified: F32.A

## 2011-10-29 HISTORY — DX: Major depressive disorder, single episode, unspecified: F32.9

## 2011-10-29 LAB — COMPREHENSIVE METABOLIC PANEL
Albumin: 3.3 g/dL — ABNORMAL LOW (ref 3.5–5.2)
Alkaline Phosphatase: 71 U/L (ref 39–117)
BUN: 13 mg/dL (ref 6–23)
Calcium: 9.6 mg/dL (ref 8.4–10.5)
GFR calc Af Amer: >90 mL/min (ref >90)
Potassium: 3.7 mEq/L (ref 3.5–5.1)
Total Protein: 8 g/dL (ref 6.0–8.3)

## 2011-10-29 LAB — RAPID URINE DRUG SCREEN, HOSP PERFORMED
Amphetamines: NOT DETECTED
Benzodiazepines: NOT DETECTED
Cocaine: NOT DETECTED
Opiates: NOT DETECTED

## 2011-10-29 LAB — ETHANOL: Alcohol, Ethyl (B): <11 mg/dL (ref 0–11)

## 2011-10-29 LAB — CBC
HCT: 39.7 % (ref 36.0–46.0)
MCH: 28.9 pg (ref 26.0–34.0)
MCHC: 33.5 g/dL (ref 30.0–36.0)
RDW: 12.4 % (ref 11.5–15.5)

## 2011-10-29 LAB — POCT PREGNANCY, URINE: Preg Test, Ur: NEGATIVE

## 2011-10-29 MED ORDER — LORAZEPAM 1 MG PO TABS: 1.0000 mg | ORAL_TABLET | Freq: Three times a day (TID) | ORAL | Status: DC | PRN

## 2011-10-29 MED ORDER — ZOLPIDEM TARTRATE 5 MG PO TABS: 5.0000 mg | ORAL_TABLET | Freq: Every evening | ORAL | Status: DC | PRN

## 2011-10-29 MED ORDER — ONDANSETRON HCL 4 MG PO TABS: 4.0000 mg | ORAL_TABLET | Freq: Three times a day (TID) | ORAL | Status: DC | PRN

## 2011-10-29 MED ORDER — IBUPROFEN 600 MG PO TABS: 600.0000 mg | ORAL_TABLET | Freq: Three times a day (TID) | ORAL | Status: DC | PRN

## 2011-10-29 MED ORDER — ALUM & MAG HYDROXIDE-SIMETH 200-200-20 MG/5ML PO SUSP: 30.0000 mL | ORAL | Status: DC | PRN

## 2011-10-29 NOTE — ED Notes (Signed)
Pt. Ate 75% of dinner. 

## 2011-10-29 NOTE — ED Notes (Signed)
Assumed care of pt. From fast track, pt. Ambulated to psych ed with [facility] NT.  Pt's 1 belongings bag locked in psych activity room.  Oriented pt. To psych ed.  Pt. Verbalized understanding.

## 2011-10-29 NOTE — ED Notes (Signed)
Pt. Very sleepy, reports that about 0900 this am, she took 1 ambien because she had not slept in 2 days.  Pt. Sleepy but able to wake up and oriented.

## 2011-10-29 NOTE — ED Notes (Signed)
Report given to Victorino Dike, RN to move pt to Rm 43.

## 2011-10-29 NOTE — ED Notes (Signed)
Pt completed tele-psych consult and recommendations had not been received. CSW contacted Specialist on Call at 9:00pm and 10:00pm to request psychiatrist's recommendations and have still not received the paperwork at this time. CSW/ACT will need to continue following this until recommendations are received.

## 2011-10-29 NOTE — ED Notes (Signed)
Pt. Has been depressed.  According to aunt, not taking depression meds at home.  Pt. Denies SI/HI.  Self reports depression.  "just want to sleep away".

## 2011-10-29 NOTE — ED Notes (Signed)
Pt. Alert and oriented, sitting up in bed eating dinner and watching tv.
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
## 2011-10-29 NOTE — ED Notes (Signed)
Pt. Asleep, breathing normal.
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
## 2011-10-29 NOTE — ED Notes (Signed)
Pt asleep and breathing normal.

## 2011-10-30 MED ORDER — CITALOPRAM HYDROBROMIDE 10 MG PO TABS: 10.0000 mg | ORAL_TABLET | Freq: Every day | ORAL | Status: DC

## 2011-10-30 MED ORDER — CITALOPRAM HYDROBROMIDE 10 MG PO TABS
10.0000 mg | ORAL_TABLET | Freq: Every day | ORAL | Status: DC
  Administered 2011-10-30: 10 mg via ORAL
  Filled 2011-10-30: qty 1

## 2011-10-30 NOTE — ED Notes (Signed)
Pt ate 60% of bkft.

## 2011-10-30 NOTE — ED Notes (Signed)
Pt. Informed of plan to D/C, pt. To call Aunt for transportation.

## 2011-10-30 NOTE — ED Notes (Signed)
Pt.  Called her Aunt at 0920 for transportation home.

## 2011-10-30 NOTE — ED Notes (Signed)
Assumed care of pt

## 2011-11-02 NOTE — ED Provider Notes (Signed)
Medical screening examination/treatment/procedure(s) were performed by non-physician practitioner and as supervising physician I was immediately available for consultation/collaboration.  Doug Sou, MD 11/02/11 [phone]

## 2012-09-05 ENCOUNTER — Emergency Department (HOSPITAL_COMMUNITY)
Admission: EM | Admit: 2012-09-05 | Discharge: 2012-09-05 | Disposition: A | Discharge status: Home / Self Care | Payer: Medicaid Other | Attending: Emergency Medicine | Admitting: Emergency Medicine

## 2012-09-05 ENCOUNTER — Encounter (HOSPITAL_COMMUNITY): Payer: Self-pay | Admitting: Emergency Medicine

## 2012-09-05 DIAGNOSIS — F32A Depression, unspecified: Secondary | ICD-10-CM

## 2012-09-05 DIAGNOSIS — F3289 Other specified depressive episodes: Secondary | ICD-10-CM | POA: Insufficient documentation

## 2012-09-05 DIAGNOSIS — N912 Amenorrhea, unspecified: Secondary | ICD-10-CM

## 2012-09-05 DIAGNOSIS — F329 Major depressive disorder, single episode, unspecified: Secondary | ICD-10-CM

## 2012-09-05 LAB — CBC WITH DIFFERENTIAL/PLATELET
Eosinophils Absolute: 0.1 10*3/uL (ref 0.0–0.7)
Eosinophils Relative: 1 % (ref 0–5)
HCT: 38.2 % (ref 36.0–46.0)
Lymphocytes Relative: 19 % (ref 12–46)
Lymphs Abs: 2.2 10*3/uL (ref 0.7–4.0)
MCH: 28.2 pg (ref 26.0–34.0)
MCV: 85.5 fL (ref 78.0–100.0)
Monocytes Absolute: 0.5 10*3/uL (ref 0.1–1.0)
Platelets: 290 10*3/uL (ref 150–400)
RBC: 4.47 MIL/uL (ref 3.87–5.11)
RDW: 13.5 % (ref 11.5–15.5)
WBC: 12.1 10*3/uL — ABNORMAL HIGH (ref 4.0–10.5)

## 2012-09-05 LAB — RAPID URINE DRUG SCREEN, HOSP PERFORMED
Amphetamines: NOT DETECTED
Benzodiazepines: NOT DETECTED
Cocaine: NOT DETECTED
Opiates: NOT DETECTED

## 2012-09-05 LAB — COMPREHENSIVE METABOLIC PANEL
CO2: 24 mEq/L (ref 19–32)
Calcium: 9.3 mg/dL (ref 8.4–10.5)
Creatinine, Ser: 0.89 mg/dL (ref 0.50–1.10)
GFR calc Af Amer: >90 mL/min (ref >90)
GFR calc non Af Amer: 81 mL/min — ABNORMAL LOW (ref >90)
Glucose, Bld: 81 mg/dL (ref 70–99)
Sodium: 139 mEq/L (ref 135–145)
Total Protein: 7.7 g/dL (ref 6.0–8.3)

## 2012-09-05 MED ORDER — NICOTINE 21 MG/24HR TD PT24: 21.0000 mg | MEDICATED_PATCH | Freq: Every day | TRANSDERMAL | Status: DC

## 2012-09-05 MED ORDER — ZOLPIDEM TARTRATE 5 MG PO TABS: 5.0000 mg | ORAL_TABLET | Freq: Every evening | ORAL | Status: DC | PRN

## 2012-09-05 MED ORDER — LORAZEPAM 1 MG PO TABS: 1.0000 mg | ORAL_TABLET | Freq: Three times a day (TID) | ORAL | Status: DC | PRN

## 2012-09-05 MED ORDER — ONDANSETRON HCL 4 MG PO TABS: 4.0000 mg | ORAL_TABLET | Freq: Three times a day (TID) | ORAL | Status: DC | PRN

## 2012-09-05 MED ORDER — ASENAPINE MALEATE 5 MG SL SUBL
5.0000 mg | SUBLINGUAL_TABLET | Freq: Every day | SUBLINGUAL | Status: DC
  Filled 2012-09-05: qty 1

## 2012-09-05 MED ORDER — ALUM & MAG HYDROXIDE-SIMETH 200-200-20 MG/5ML PO SUSP: 30.0000 mL | ORAL | Status: DC | PRN

## 2012-09-05 MED ORDER — VILAZODONE HCL 20 MG PO TABS
20.0000 mg | ORAL_TABLET | Freq: Every day | ORAL | Status: DC
  Filled 2012-09-05: qty 1

## 2012-09-05 MED ORDER — ACETAMINOPHEN 325 MG PO TABS: 650.0000 mg | ORAL_TABLET | ORAL | Status: DC | PRN

## 2012-09-05 NOTE — ED Notes (Signed)
Pt is here for preg test and also depression. Pt is depress and worry that she may be pregnant and since she already has 4 children home; she can't afford another child. Pt has suffered from Depression for many years and refused to get her depression treat here today. Had psy therapy 3 times a week for depression. Pt said that if she change her mind, she will come back again. NO SI or HI

## 2012-09-05 NOTE — ED Notes (Addendum)
Patient is crying upon entering the room.  Patient states she suffers from depression and it has been worse lately, R/T the fact that she thinks she is pregnant and already has 4 children.  Patient states she has not had a period this month and would like to have a pregnancy test done.

## 2012-09-05 NOTE — ED Notes (Signed)
Upon discharging this patient, pt is thinking about medical clearance for Depression.
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
## 2012-09-05 NOTE — ED Provider Notes (Signed)
History     CSN: [id number]  Arrival date & time 09/05/12  1350   First MD Initiated Contact with Patient 09/05/12 1503      Chief Complaint  Patient presents with  . Menstrual Problem    (Consider location/radiation/quality/duration/timing/severity/associated sxs/prior treatment) HPI Comments: 41 year old female presents emergency department complaining of amenorrhea.  Last menstrual period occurred last month for 3 days in the beginning of the month in 3 days in the end of the month.  Patient states that she is due and has not had her period.  She states concern because she RE has 4 children does not want to be pregnant.  Patient suffers from depression that is currently being followed by Dr. Mila Homer with therapy sessions Monday Wednesday and Friday.  Patient denies any suicidal ideations and has had no attempts before in the past.  The history is provided by the patient.    Past Medical History  Diagnosis Date  . Depression     Past Surgical History  Procedure Date  . Knee surgery     History reviewed. No pertinent family history.  History  Substance Use Topics  . Smoking status: Current Some Day Smoker  . Smokeless tobacco: Not on file  . Alcohol Use: Yes     occassionally    OB History    Grav Para Term Preterm Abortions TAB SAB Ect Mult Living                  Review of Systems  Constitutional: Negative for fever, diaphoresis and activity change.  HENT: Negative for congestion and neck pain.   Respiratory: Negative for cough.   Genitourinary: Negative for dysuria.  Musculoskeletal: Negative for myalgias.  Skin: Negative for color change and wound.  Neurological: Negative for headaches.  Psychiatric/Behavioral: Negative for suicidal ideas.  All other systems reviewed and are negative.    Allergies  Review of patient's allergies indicates no known allergies.  Home Medications   Current Outpatient Rx  Name Route Sig Dispense Refill  . ASENAPINE  MALEATE 5 MG SL SUBL Sublingual Place 5 mg under the tongue at bedtime.    Marland Kitchen VILAZODONE HCL 20 MG PO TABS Oral Take 20 mg by mouth daily.      BP 127/88  Pulse 72  Temp 98.4 F (36.9 C) (Oral)  Resp 20  Ht 4\' 11"  (1.499 m)  Wt 157 lb (71.215 kg)  BMI 31.71 kg/m2  SpO2 100%  Physical Exam  Nursing note and vitals reviewed. Constitutional: She is oriented to person, place, and time. She appears well-developed and well-nourished. No distress.  HENT:  Head: Normocephalic and atraumatic.  Mouth/Throat: Oropharynx is clear and moist. No oropharyngeal exudate.  Eyes: Conjunctivae normal and EOM are normal. Pupils are equal, round, and reactive to light. No scleral icterus.  Neck: Normal range of motion. Neck supple. No tracheal deviation present. No thyromegaly present.  Cardiovascular: Normal rate, regular rhythm, normal heart sounds and intact distal pulses.   Pulmonary/Chest: Effort normal and breath sounds normal. No stridor. No respiratory distress. She has no wheezes.  Abdominal: Soft. Bowel sounds are normal. There is no tenderness.  Musculoskeletal: Normal range of motion. She exhibits no edema and no tenderness.  Neurological: She is alert and oriented to person, place, and time. Coordination normal.  Skin: Skin is warm and dry. No rash noted. She is not diaphoretic. No erythema. No pallor.  Psychiatric: She has a normal mood and affect. Her behavior is normal.  ED Course  Procedures (including critical care time)   Labs Reviewed  POCT PREGNANCY, URINE   No results found.   No diagnosis found.    MDM  Amenorrhea   41 yo F to ER e concern of pregnancy. Took home test last week that was negative. LMP 08/12/2012. Pt relieved when result was negative. Advised follow up w OBGYN to discuss menopause.   Addendum: after discharging pt she explains that she is suffering from severe depression and she is having worsening thoughts. She has recently had a medication switch &  does not believe her 3 x a week therapy sessions are working. She denies suicidal plan and repeatedly states that she did not want to do her family and that she needs to get better.  Current Plan is to have patient be evaluated by ACT for further assessment on weather or not to be placed inpatient.  We have discussed that If the patient feels he was becoming unsafe, instead of acting on an impulse of self harm he will contact the crisis line, speak to her family about it, or return to the emergency department. Patient has been cleared to move to [facility] pending further assessment.   Addendum #2: ACT has seen patient and requests pt be dc home. She has a reliable support system, lives with her family and is being closely followed by psychiatry. STRICT return precautions discussed.        Jaci Carrel, PA-C 09/05/12 1550  [address], PA-C 09/05/12 [address], PA-C 09/05/12 2102

## 2012-09-05 NOTE — ED Notes (Signed)
Pt placed in blue gown

## 2012-09-05 NOTE — ED Notes (Signed)
wanded by security, family member took all the belongings home.

## 2012-09-07 NOTE — ED Provider Notes (Signed)
Medical screening examination/treatment/procedure(s) were performed by non-physician practitioner and as supervising physician I was immediately available for consultation/collaboration.  Regana Kemple, MD 09/07/12 1804 

## 2012-09-14 ENCOUNTER — Telehealth (HOSPITAL_COMMUNITY): Payer: Self-pay

## 2012-09-14 ENCOUNTER — Inpatient Hospital Stay (HOSPITAL_COMMUNITY)
Admission: RE | Admit: 2012-09-14 | Discharge: 2012-09-21 | DRG: 885 | Disposition: A | Discharge status: Home / Self Care | Payer: Medicaid Other | Attending: Psychiatry | Admitting: Psychiatry

## 2012-09-14 ENCOUNTER — Encounter (HOSPITAL_COMMUNITY): Payer: Self-pay | Admitting: *Deleted

## 2012-09-14 DIAGNOSIS — F4310 Post-traumatic stress disorder, unspecified: Secondary | ICD-10-CM

## 2012-09-14 DIAGNOSIS — IMO0002 Reserved for concepts with insufficient information to code with codable children: Secondary | ICD-10-CM

## 2012-09-14 DIAGNOSIS — F339 Major depressive disorder, recurrent, unspecified: Secondary | ICD-10-CM | POA: Diagnosis present

## 2012-09-14 DIAGNOSIS — F333 Major depressive disorder, recurrent, severe with psychotic symptoms: Principal | ICD-10-CM | POA: Diagnosis present

## 2012-09-14 DIAGNOSIS — I10 Essential (primary) hypertension: Secondary | ICD-10-CM | POA: Diagnosis present

## 2012-09-14 DIAGNOSIS — Z79899 Other long term (current) drug therapy: Secondary | ICD-10-CM

## 2012-09-14 DIAGNOSIS — Z8673 Personal history of transient ischemic attack (TIA), and cerebral infarction without residual deficits: Secondary | ICD-10-CM

## 2012-09-14 DIAGNOSIS — R45851 Suicidal ideations: Secondary | ICD-10-CM

## 2012-09-14 HISTORY — DX: Essential (primary) hypertension: I10

## 2012-09-14 HISTORY — DX: Cerebral infarction, unspecified: I63.9

## 2012-09-14 HISTORY — DX: Anxiety disorder, unspecified: F41.9

## 2012-09-14 MED ORDER — TRAZODONE HCL 50 MG PO TABS
50.0000 mg | ORAL_TABLET | Freq: Every evening | ORAL | Status: DC | PRN
  Administered 2012-09-15 – 2012-09-16 (×3): 50 mg via ORAL
  Filled 2012-09-14 (×3): qty 1

## 2012-09-14 MED ORDER — ACETAMINOPHEN 325 MG PO TABS: 650.0000 mg | ORAL_TABLET | Freq: Four times a day (QID) | ORAL | Status: DC | PRN

## 2012-09-14 MED ORDER — MAGNESIUM HYDROXIDE 400 MG/5ML PO SUSP: 30.0000 mL | Freq: Every day | ORAL | Status: DC | PRN

## 2012-09-14 MED ORDER — ALUM & MAG HYDROXIDE-SIMETH 200-200-20 MG/5ML PO SUSP: 30.0000 mL | ORAL | Status: DC | PRN
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
## 2012-09-15 ENCOUNTER — Encounter (HOSPITAL_COMMUNITY): Payer: Self-pay | Admitting: *Deleted

## 2012-09-15 DIAGNOSIS — F332 Major depressive disorder, recurrent severe without psychotic features: Secondary | ICD-10-CM

## 2012-09-15 DIAGNOSIS — F339 Major depressive disorder, recurrent, unspecified: Secondary | ICD-10-CM | POA: Diagnosis present

## 2012-09-15 DIAGNOSIS — F4310 Post-traumatic stress disorder, unspecified: Secondary | ICD-10-CM | POA: Diagnosis present

## 2012-09-15 DIAGNOSIS — I10 Essential (primary) hypertension: Secondary | ICD-10-CM | POA: Diagnosis present

## 2012-09-15 LAB — URINE MICROSCOPIC-ADD ON

## 2012-09-15 LAB — CBC
MCH: 28 pg (ref 26.0–34.0)
MCHC: 32.7 g/dL (ref 30.0–36.0)
Platelets: 294 10*3/uL (ref 150–400)
RDW: 13.3 % (ref 11.5–15.5)

## 2012-09-15 LAB — URINALYSIS, ROUTINE W REFLEX MICROSCOPIC
Glucose, UA: NEGATIVE mg/dL
Hgb urine dipstick: NEGATIVE
Specific Gravity, Urine: 1.022 (ref 1.005–1.030)
Urobilinogen, UA: 1 mg/dL (ref 0.0–1.0)

## 2012-09-15 LAB — COMPREHENSIVE METABOLIC PANEL
ALT: 8 U/L (ref 0–35)
AST: 12 U/L (ref 0–37)
Calcium: 9.2 mg/dL (ref 8.4–10.5)
GFR calc Af Amer: >90 mL/min (ref >90)
Sodium: 136 mEq/L (ref 135–145)
Total Protein: 7.9 g/dL (ref 6.0–8.3)

## 2012-09-15 LAB — RAPID URINE DRUG SCREEN, HOSP PERFORMED
Amphetamines: NOT DETECTED
Opiates: NOT DETECTED

## 2012-09-15 LAB — PREGNANCY, URINE: Preg Test, Ur: NEGATIVE

## 2012-09-15 MED ORDER — ASENAPINE MALEATE 5 MG SL SUBL
5.0000 mg | SUBLINGUAL_TABLET | Freq: Every day | SUBLINGUAL | Status: DC
  Administered 2012-09-15: 5 mg via SUBLINGUAL
  Filled 2012-09-15 (×5): qty 1

## 2012-09-15 MED ORDER — VILAZODONE HCL 20 MG PO TABS
20.0000 mg | ORAL_TABLET | Freq: Every day | ORAL | Status: DC
  Administered 2012-09-16: 20 mg via ORAL
  Filled 2012-09-15 (×6): qty 1

## 2012-09-15 NOTE — Progress Notes (Signed)
Psychoeducational Group Note  Date:  09/15/2012 Time:  2000  Group Topic/Focus:  Karaoke   Participation Level:  Active  Participation Quality:  Appropriate  Affect:  Appropriate and Excited  Cognitive:  Appropriate  Insight:  Good  Engagement in Group:  Good  Additional Comments:    Leilani Cespedes A 09/15/2012, 10:08 PM
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
## 2012-09-15 NOTE — Progress Notes (Signed)
[facility] Adult Inpatient Family/Significant Other Suicide Prevention Education  Suicide Prevention Education:  Education Completed; [name] - aunt [phone]),  (name of family member/significant other) has been identified by the patient as the family member/significant other with whom the patient will be residing, and identified as the person(s) who will aid the patient in the event of a mental health crisis (suicidal ideations/suicide attempt).  With written consent from the patient, the family member/significant other has been provided the following suicide prevention education, prior to the and/or following the discharge of the patient.  The suicide prevention education provided includes the following:  Suicide risk factors  Suicide prevention and interventions  National Suicide Hotline telephone number  [facility] assessment telephone number  [facility] Emergency Assistance 911  [facility] and/or Residential Mobile Crisis Unit telephone number  Request made of family/significant other to:  Remove weapons (e.g., guns, rifles, knives), all items previously/currently identified as safety concern.    Remove drugs/medications (over-the-counter, prescriptions, illicit drugs), all items previously/currently identified as a safety concern.  The family member/significant other verbalizes understanding of the suicide prevention education information provided.  The family member/significant other agrees to remove the items of safety concern listed above. Ms. [name] states that she has noticed pt more depressed over the last month with crying spells.  Ms. [name] had no further concerns.    [name] 09/15/2012, 11:41 AM
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
## 2012-09-15 NOTE — Social Work (Signed)
Aftercare Planning Group: 09/15/2012 9:45 AM  Pt attended discharge planning group and actively participated in group.  SW provided pt with today's workbook.  Pt presents with flat affect and depressed mood.  Pt rates depression at a 3 and denies having anxiety, SI/HI.  Pt states that she came the hospital due to depression.  Pt states that she lives at home with her 3 of her 4 children.  Pt states that she was going to [facility] for outpatient services and can return there upon d/c.  No further needs voiced by pt at this time.  Safety planning and suicide prevention discussed.  Pt participated in discussion and acknowledged an understanding of the information provided.       [facility] Group Notes:  (Counselor/Nursing/MHT/Case Management/Adjunct)  09/15/2012  1:15 PM  Type of Therapy:  Group Therapy  Participation Level:  Active  Participation Quality:  Appropriate and Attentive  Affect:  Depressed and Flat  Cognitive:  Alert and Appropriate  Insight:  Good  Engagement in Group:  Good  Engagement in Therapy:  Good  Modes of Intervention:  Socialization and Support  Summary of Progress/Problems: Patient was attentive and engaged with speaker from [employer].  Patient expressed interest in their programs and services.  Patient processed ways they can relate to the speaker.      Reyes Ivan, LCSWA 09/15/2012 9:53 AM

## 2012-09-15 NOTE — Tx Team (Signed)
Initial Interdisciplinary Treatment Plan  PATIENT STRENGTHS: (choose at least two) Motivation for treatment/growth Physical Health  PATIENT STRESSORS: Financial difficulties Marital or family conflict Medication change or noncompliance Occupational concerns   PROBLEM LIST: Problem List/Patient Goals Date to be addressed Date deferred Reason deferred Estimated date of resolution  Depression      Sadness      Suicidal ideation                                           DISCHARGE CRITERIA:  Ability to meet basic life and health needs Improved stabilization in mood, thinking, and/or behavior Motivation to continue treatment in a less acute level of care Verbal commitment to aftercare and medication compliance  PRELIMINARY DISCHARGE PLAN: Outpatient therapy Return to previous living arrangement  PATIENT/FAMIILY INVOLVEMENT: This treatment plan has been presented to and reviewed with the patient, CARIANN KINNAMON.  The patient and family have been given the opportunity to ask questions and make suggestions.  Dion Saucier M 09/15/2012, 12:12 AM

## 2012-09-15 NOTE — Progress Notes (Signed)
D: Patient at the window for her HS medications. She reported feeling better; "lot better that she was before". Her mood and affect appropriate. She denied SI/HI and denied hallucinations. Her HS medication not available, [employer] notified. A: Writer offered Trazodone at HS. Encouraged and supported. R: Received medications without difficulty. Q 15 minute check continues to maintain safety.

## 2012-09-15 NOTE — Progress Notes (Signed)
  [facility] Group Notes:  (Counselor/Nursing/MHT/Case Management/Adjunct)  09/15/2012 11:56 AM  Type of Therapy:  Psychoeducational Skills  Participation Level:  Minimal  Participation Quality:  Appropriate, Attentive and Sharing  Affect:  Depressed and Flat  Cognitive:  Alert and Appropriate  Insight:  Good  Engagement in Group:  Limited  Modes of Intervention:  Activity, Education, Socialization and Support  Summary of Progress/Problems:  Pt participated in the Discovering Joy group and shared that her fourth child, a girl, gives her great joy. Pt shared that she hasn't felt joy in a while. Pt shared only when prompted by this staff. Gwyndolyn Kaufman 09/15/2012, 11:56 AM
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
## 2012-09-15 NOTE — Progress Notes (Signed)
D:  Patient up and attending groups today.   She is very quiet, but interacts and shares with some encouragement.  She rates her depression and hopelessness at 3 today and states she still has some anxiety, but not as bad as yesterday.  States she slept well after receiving Trazodone last night.  States she wants to return to [facility] after she is discharged. A:  Encouraged participation in groups and encouraged her to be out of her room.  She has been given permission to attend groups on the 500 hallway.   R:  Pleasant and cooperative.  No negative behaviors noted.  Interacting well with staff and peers.  Safety is maintained.

## 2012-09-16 LAB — URINE CULTURE: Colony Count: 9000

## 2012-09-16 MED ORDER — ASENAPINE MALEATE 5 MG SL SUBL
5.0000 mg | SUBLINGUAL_TABLET | Freq: Every day | SUBLINGUAL | Status: DC
  Administered 2012-09-16 – 2012-09-20 (×5): 5 mg via SUBLINGUAL
  Filled 2012-09-16 (×6): qty 1

## 2012-09-16 MED ORDER — VILAZODONE HCL 20 MG PO TABS
30.0000 mg | ORAL_TABLET | Freq: Every day | ORAL | Status: DC
  Administered 2012-09-17 – 2012-09-18 (×2): 30 mg via ORAL
  Administered 2012-09-19: 08:00:00 via ORAL
  Filled 2012-09-16 (×5): qty 1.5

## 2012-09-16 NOTE — Progress Notes (Signed)
Writer went to patient's room this morning to check on her. She almost fell. Patient stated that the medication she took last night made still made her drowsy. Patient insisted on going to cafeteria because  her auntie suppose to be visiting at this morning. Writer notified security to sent sister to the unit; Tech to bring her breakfast to the unit for her and patient to stay in bed until better.  Patient receptive to that option. Reported off to the incoming RN.

## 2012-09-16 NOTE — Progress Notes (Signed)
[facility] MD Progress Note  09/16/2012 1:56 PM  Diagnosis:   Axis I: Major Depression, Recurrent severe and Post Traumatic Stress Disorder Axis II: Deferred Axis III:  Past Medical History  Diagnosis Date  . Depression   . Hypertension   . Anxiety   . Stroke     Pt unclear historian- had "ministrokes"   Axis IV: economic problems, occupational problems and problems with primary support group Axis V: 51-60 moderate symptoms  ADL's:  Intact  Sleep: Good  Appetite:  Good  Suicidal Ideation:  Plan:  Deneis Intent:  Denies Means:  Denies Homicidal Ideation:  Plan:  Denies Intent:  Denies Means:  Denies [name] admits that she was not aware that she has gotten so isolated, so guarded, so to herself. She admits that last night in the group activity she realized she has not been herself. Admits to anhedonia, that she fell into hopelessness, helplessness. She is more hopeful today. She did not sleep well last night. She still endorses worrying. She realized that she is "addicted" to worrying and being depressed. It has gone for so long that as of lately that has become her reality. She is planning to push herself to be more active, to reach out. Wants to slowly make it back to her church. Reports no side effects to the Viibryd so far. Mental Status Examination/Evaluation: Objective:  Appearance: Fairly Groomed  Patent attorney::  Fair  Speech:  Clear and Coherent  Volume:  Decreased  Mood:  Depressed  Affect:  Restricted  Thought Process:  Coherent, Goal Directed, Intact and Logical  Orientation:  Full  Thought Content:  WDL  Suicidal Thoughts:  No  Homicidal Thoughts:  No  Memory:  Immediate;   Fair Recent;   Fair Remote;   Fair  Judgement:  Intact  Insight:  Fair  Psychomotor Activity:  Normal  Concentration:  Fair  Recall:  Fair  Akathisia:  No  Handed:  Right  AIMS (if indicated):     Assets:  Desire for Improvement Housing Talents/Skills Vocational/Educational  Sleep:   Number of Hours: 5.75    Vital Signs:Blood pressure 111/76, pulse 101, temperature 98.4 F (36.9 C), temperature source Oral, resp. rate 16, height 4\' 11"  (1.499 m), weight 76.204 kg (168 lb), last menstrual period 08/20/2012. Current Medications: Current Facility-Administered Medications  Medication Dose Route Frequency Provider Last Rate Last Dose  . acetaminophen (TYLENOL) tablet 650 mg  650 mg Oral Q6H PRN Kerry Hough, PA      . alum & mag hydroxide-simeth (MAALOX/MYLANTA) 200-200-20 MG/5ML suspension 30 mL  30 mL Oral Q4H PRN Kerry Hough, PA      . asenapine (SAPHRIS) sublingual tablet 5 mg  5 mg Sublingual Q2000 Rachael Fee, MD      . magnesium hydroxide (MILK OF MAGNESIA) suspension 30 mL  30 mL Oral Daily PRN Kerry Hough, PA      . traZODone (DESYREL) tablet 50 mg  50 mg Oral QHS PRN,MR X 1 Jorje Guild, PA-C   50 mg at 09/15/12 2150  . Vilazodone HCl TABS 30 mg  30 mg Oral Daily Rachael Fee, MD      . DISCONTD: asenapine (SAPHRIS) sublingual tablet 5 mg  5 mg Sublingual QHS Verne Spurr, PA-C   5 mg at 09/15/12 2200  . DISCONTD: Vilazodone HCl TABS 20 mg  20 mg Oral Daily Verne Spurr, PA-C   20 mg at 09/16/12 9604    Lab Results:  Results for orders  placed during the hospital encounter of 09/14/12 (from the past 48 hour(s))  URINALYSIS, ROUTINE W REFLEX MICROSCOPIC     Status: Abnormal   Collection Time   09/15/12  6:04 AM      Component Value Range Comment   Color, Urine YELLOW  YELLOW    APPearance CLOUDY (*) CLEAR    Specific Gravity, Urine 1.022  1.005 - 1.030    pH 6.0  5.0 - 8.0    Glucose, UA NEGATIVE  NEGATIVE mg/dL    Hgb urine dipstick NEGATIVE  NEGATIVE    Bilirubin Urine NEGATIVE  NEGATIVE    Ketones, ur NEGATIVE  NEGATIVE mg/dL    Protein, ur 956 (*) NEGATIVE mg/dL    Urobilinogen, UA 1.0  0.0 - 1.0 mg/dL    Nitrite POSITIVE (*) NEGATIVE    Leukocytes, UA NEGATIVE  NEGATIVE   PREGNANCY, URINE     Status: Normal   Collection Time   09/15/12   6:04 AM      Component Value Range Comment   Preg Test, Ur NEGATIVE  NEGATIVE   URINE RAPID DRUG SCREEN (HOSP PERFORMED)     Status: Normal   Collection Time   09/15/12  6:04 AM      Component Value Range Comment   Opiates NONE DETECTED  NONE DETECTED    Cocaine NONE DETECTED  NONE DETECTED    Benzodiazepines NONE DETECTED  NONE DETECTED    Amphetamines NONE DETECTED  NONE DETECTED    Tetrahydrocannabinol NONE DETECTED  NONE DETECTED    Barbiturates NONE DETECTED  NONE DETECTED   URINE MICROSCOPIC-ADD ON     Status: Abnormal   Collection Time   09/15/12  6:04 AM      Component Value Range Comment   Squamous Epithelial / LPF FEW (*) RARE    WBC, UA 3-6  <3 WBC/hpf    RBC / HPF 0-2  <3 RBC/hpf    Bacteria, UA MANY (*) RARE    Casts GRANULAR CAST (*) NEGATIVE WBC CAST  URINE CULTURE     Status: Normal   Collection Time   09/15/12  6:04 AM      Component Value Range Comment   Specimen Description URINE, CLEAN CATCH      Special Requests NONE      Culture  Setup Time 09/15/2012 13:54      Colony Count 9,000 COLONIES/ML      Culture INSIGNIFICANT GROWTH      Report Status 09/16/2012 FINAL     CBC     Status: Normal   Collection Time   09/15/12  6:25 AM      Component Value Range Comment   WBC 9.6  4.0 - 10.5 K/uL    RBC 4.39  3.87 - 5.11 MIL/uL    Hemoglobin 12.3  12.0 - 15.0 g/dL    HCT 21.3  08.6 - 57.8 %    MCV 85.6  78.0 - 100.0 fL    MCH 28.0  26.0 - 34.0 pg    MCHC 32.7  30.0 - 36.0 g/dL    RDW 46.9  62.9 - 52.8 %    Platelets 294  150 - 400 K/uL   COMPREHENSIVE METABOLIC PANEL     Status: Abnormal   Collection Time   09/15/12  6:25 AM      Component Value Range Comment   Sodium 136  135 - 145 mEq/L    Potassium 3.4 (*) 3.5 - 5.1 mEq/L    Chloride 102  96 - 112 mEq/L    CO2 25  19 - 32 mEq/L    Glucose, Bld 77  70 - 99 mg/dL    BUN 15  6 - 23 mg/dL    Creatinine, Ser 0.45  0.50 - 1.10 mg/dL    Calcium 9.2  8.4 - 40.9 mg/dL    Total Protein 7.9  6.0 - 8.3 g/dL     Albumin 3.3 (*) 3.5 - 5.2 g/dL    AST 12  0 - 37 U/L    ALT 8  0 - 35 U/L    Alkaline Phosphatase 74  39 - 117 U/L    Total Bilirubin 0.4  0.3 - 1.2 mg/dL    GFR calc non Af Amer 82 (*) >90 mL/min    GFR calc Af Amer >90  >90 mL/min   TSH     Status: Normal   Collection Time   09/15/12  6:25 AM      Component Value Range Comment   TSH 1.784  0.350 - 4.500 uIU/mL     Physical Findings: AIMS: Facial and Oral Movements Muscles of Facial Expression: None, normal Lips and Perioral Area: None, normal Jaw: None, normal Tongue: None, normal,Extremity Movements Upper (arms, wrists, hands, fingers): None, normal Lower (legs, knees, ankles, toes): None, normal, Trunk Movements Neck, shoulders, hips: None, normal, Overall Severity Severity of abnormal movements (highest score from questions above): None, normal Incapacitation due to abnormal movements: None, normal Patient's awareness of abnormal movements (rate only patient's report): No Awareness, Dental Status Current problems with teeth and/or dentures?: Yes (multiple cavities and broken teeth) Does patient usually wear dentures?: No  CIWA:    COWS:     Treatment Plan Summary: Daily contact with patient to assess and evaluate symptoms and progress in treatment Medication management  Plan: Supportive approach/coping skills Increase the Viibryd to 30 mg daily  [name] 09/16/2012, 1:56 PM

## 2012-09-16 NOTE — Progress Notes (Signed)
D  Slept after taking Trazadone last nite but didn't sleep as well as usual and took another prn med which has her dizzy this morning and not able to get up and move about well, states her appetite is improving, her energy level is low and ability to pay attention is poor d/t her depression and lack of motivation, states she is depressed at 5/10 and feels helpless 2/10 today, is not WD from drugs or alcohol, denies Si or Hi today. Taking meds as ordered by MD, attending group this morning, taking her time to get to room, to get up OOB d/t nite meds and still feeling dizzy. A q22min safety checks continue and support offered, isolative to room, avoids peers, encouraged to continue attending group and to talk to staff and try to interact w/peers in the dayroom when she can R patient remains safe on the Unit.

## 2012-09-16 NOTE — Progress Notes (Signed)
Psychoeducational Group Note  Date:  09/16/2012 Time:  1100  Group Topic/Focus:  Relapse Prevention Planning:   The focus of this group is to define relapse and discuss the need for planning to combat relapse.  Participation Level:  Minimal  Participation Quality:  Appropriate, Attentive and Resistant  Affect:  Appropriate and Flat  Cognitive:  Alert and Oriented  Insight:  Good  Engagement in Group:  Limited  Additional Comments:  Pt attended group discussing Relapse prevention planning but participation was limited. Pt appeared to be engaged and listening to group discussion.   Dalia Heading 09/16/2012, 2:59 PM

## 2012-09-16 NOTE — Social Work (Signed)
Aftercare Planning Group: 09/16/2012 9:45 AM  Pt did not attend d/c planning group on this date.  SW met with pt individually at this time.  Pt presents with flat affect and depressed mood.  Pt states that she is learning she has to take better care of herself and create more time for herself.  Pt will return to [facility] for services.  No further needs voiced by pt at this time.    [facility] Group Notes:  (Counselor/Nursing/MHT/Case Management/Adjunct)  09/16/2012  1:15 PM  Type of Therapy:  Group Therapy  Participation Level:  Appropriate  Participation Quality:  Appropriate   Affect:  Depressed  Cognitive:  Alert  Insight:  Good  Engagement in Group:  Good  Engagement in Therapy:  Good  Modes of Intervention:  Problem-solving, Socialization and Support  Summary of Progress/Problems: The topic for today was feelings about relapse.  Pt discussed what relapse prevention is to them and identified triggers that they are on the path to relapse.  Pt processed their feeling towards relapse and was able to relate to peers.  Pt discussed coping skills that can be used for relapse prevention.   Pt discussed how relapse relates to her depression vs substance use and how she is learning about her diagnosis and triggers.     Reyes Ivan, LCSWA 09/16/2012 2:29 PM
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
## 2012-09-16 NOTE — Tx Team (Signed)
Interdisciplinary Treatment Plan Update (Adult)  Date:  09/16/2012  Time Reviewed:  9:48 AM   Progress in Treatment: Attending groups: Yes Participating in groups:  Yes Taking medication as prescribed: Yes Tolerating medication:  Yes Family/Significant othe contact made:  Yes Patient understands diagnosis:  Yes Discussing patient identified problems/goals with staff:  Yes Medical problems stabilized or resolved:  Yes Denies suicidal/homicidal ideation: Yes Issues/concerns per patient self-inventory:  None identified Other: N/A  New problem(s) identified: None Identified  Reason for Continuation of Hospitalization: Anxiety Depression Medication stabilization  Interventions implemented related to continuation of hospitalization: mood stabilization, medication monitoring and adjustment, group therapy and psycho education, safety checks q 15 mins  Additional comments: N/A  Estimated length of stay: 3-5 days  Discharge Plan: SW is assessing for appropriate referrals.    New goal(s): N/A  Review of initial/current patient goals per problem list:    1.  Goal(s): Address substance use  Met:  No  Target date: by discharge  As evidenced by: completing detox protocol and refer to appropriate treatment  2.  Goal (s): Reduce depressive and anxiety symptoms  Met:  No  Target date: by discharge  As evidenced by: Reducing depression from a 10 to a 3 as reported by pt.    3.  Goal(s): Eliminate SI  Met:  No  Target date: by discharge  As evidenced by: Pt denying SI.   Attendees: Patient:     Family:     Physician: Geoffery Lyons, MD 09/16/2012 9:48 AM   Nursing: Alease Frame, RN 09/16/2012 9:48 AM   Clinical Social Worker:  Reyes Ivan, LCSWA 09/16/2012  9:48 AM   Other: Stephannie Li, RN 09/16/2012  9:48 AM   Other:  Ashley Jacobs, LCSW 09/16/2012 9:49 AM   Other:     Other:     Other:      Scribe for Treatment Team:   Reyes Ivan 09/16/2012 9:48 AM

## 2012-09-17 DIAGNOSIS — F333 Major depressive disorder, recurrent, severe with psychotic symptoms: Principal | ICD-10-CM

## 2012-09-17 MED ORDER — TRAZODONE HCL 100 MG PO TABS
100.0000 mg | ORAL_TABLET | Freq: Every evening | ORAL | Status: DC | PRN
  Administered 2012-09-17 – 2012-09-20 (×6): 100 mg via ORAL
  Filled 2012-09-17 (×12): qty 1

## 2012-09-17 NOTE — Progress Notes (Signed)
Psychoeducational Group Note  Date:  09/17/2012 Time:  1315  Group Topic/Focus:  Developing a Wellness Toolbox:   The focus of this group is to help patients develop a "wellness toolbox" with skills and strategies to promote recovery upon discharge.  Participation Level:  Active  Participation Quality:  Appropriate and Attentive  Affect:  Excited  Cognitive:  Alert and Appropriate  Insight:  Good  Engagement in Group:  Good  Additional Comments:   Shadrach Bartunek, Amil Amen 09/17/2012, 2:20 PM

## 2012-09-17 NOTE — Progress Notes (Signed)
Agree with plan 

## 2012-09-17 NOTE — Clinical Social Work Psych Note (Signed)
[facility] Group Notes:  (Counselor/Nursing/MHT/Case Management/Adjunct)  09/17/2012  12:16p.m  Type of Therapy:  Group Therapy  Participation Level:  Active  Participation Quality:  Appropriate  Affect:  Appropriate  Cognitive:  Alert  Insight:  Good  Engagement in Group:  Good  Engagement in Therapy:  Good  Modes of Intervention:  Socialization, Support, Clarification and Education  Summary of Progress/Problems:The process group therapy session focused on self-sabotaging and enabling behaviors.  The patients were guided to identify and discuss how these behaviors related to their individual situations and how to limit the behaviors in their personal lives.  The patient reported that engaged in these behaviors by being unable to control her anger effectively and low sense of self-worth.   Valleri Hendricksen Claudette Laws, [place] 09/17/2012 12:16 PM

## 2012-09-17 NOTE — Progress Notes (Signed)
Patient ID: [name], female   DOB: 1971/09/18, 41 y.o.   MRN: [id number]  D: Patient appears to be sleeping on approach. Respirations even and non-labored.  A: Staff will monitor on q 15 minute checks and follow treatments and give meds as ordered. R: No response from patient due to sleeping.

## 2012-09-17 NOTE — Progress Notes (Signed)
Patient did attend the evening speaker AA meeting.  

## 2012-09-17 NOTE — Progress Notes (Signed)
D-Patient is out in milieu interacting with peers and attending groups with good participation. Reports fair sleep,good appetite, and low energy level. Rates depression at 5 and hopelessness at 6. Denies SI and contracts for safety.  Affect became brighter as afternoon went on. A- Support and encouragement given. 15' checks continued. Scheduled medication compliance and no prn's requested. Continue POC and evaluation of goals.  R- Remains safe. Treatment goal compliance.

## 2012-09-17 NOTE — Progress Notes (Signed)
[facility] MD Progress Note  09/17/2012 5:46 PM  Diagnosis:  AXIS I:  MDD recurrent severe w/psychotic features AXIS II:  Deferred AXIS III:   Past Medical History  Diagnosis Date  . Hypertension   . Stroke     Pt unclear historian- had "ministrokes"   AXIS IV:  Problems with support and access to health care AXIS V:  41-50 serious symptoms  "Im having a good day. Not too worried. My mind is occupied. Going to group therapy has helped." I learned a lot going to groups, understanding depression, and also understanding that other people have symptoms like me and Im not alone." "I wanna be positive, but everyone looks at me like im suppose to be the strong one. They expect so much out of me. I always have to be strong. They have run my strength down. I worry about my 76 year old son. I lost my father in 2009, who was a big support and helped me stay strong."  ADL's:  Intact  Sleep: Poor " It took me a long time to get to sleep."  Appetite:  Good "Getting back to normal."  Suicidal Ideation:  Plan:  "Im afraid to live, but Im afraid to die..My thoughts of my kids keep me here." Intent:  Fleeting thoughts without intent. Pt contracts for safety. Means:  none  Homicidal Ideation:  Plan:  denies Intent:  denies Means:  none  AEB (as evidenced by):  Mental Status Examination/Evaluation: Objective:  Appearance: Fairly Groomed  Eye Contact::  Good  Speech:  Normal Rate  Volume:  Decreased  Mood:  Depressed  Affect:  Labile  Thought Process:  Goal Directed, Linear and Logical  Orientation:  Full  Thought Content:  Hallucinations: Auditory Visual  Suicidal Thoughts:  Yes.  without intent/plan  Homicidal Thoughts:  No  Memory:  Immediate;   Good  Judgement:  Impaired  Insight:  Lacking  Psychomotor Activity:  Normal  Concentration:  Fair  Recall:  Good  Akathisia:  No  Handed:  Right  AIMS (if indicated):     Assets:  Physical Health Resilience  Sleep:  Number of Hours:  5.75    Vital Signs:Blood pressure 111/74, pulse 102, temperature 97.6 F (36.4 C), temperature source Oral, resp. rate 16, height 4\' 11"  (1.499 m), weight 76.204 kg (168 lb), last menstrual period 08/20/2012.  Current Medications: Current Facility-Administered Medications  Medication Dose Route Frequency Provider Last Rate Last Dose  . acetaminophen (TYLENOL) tablet 650 mg  650 mg Oral Q6H PRN Kerry Hough, PA      . alum & mag hydroxide-simeth (MAALOX/MYLANTA) 200-200-20 MG/5ML suspension 30 mL  30 mL Oral Q4H PRN Kerry Hough, PA      . asenapine (SAPHRIS) sublingual tablet 5 mg  5 mg Sublingual Q2000 Rachael Fee, MD   5 mg at 09/16/12 2217  . magnesium hydroxide (MILK OF MAGNESIA) suspension 30 mL  30 mL Oral Daily PRN Kerry Hough, PA      . traZODone (DESYREL) tablet 50 mg  50 mg Oral QHS PRN,MR X 1 Jorje Guild, PA-C   50 mg at 09/16/12 2218  . Vilazodone HCl TABS 30 mg  30 mg Oral Daily Rachael Fee, MD   30 mg at 09/17/12 1610   Lab Results: No results found for this or any previous visit (from the past 48 hour(s)).     Physical Findings: AIMS: Facial and Oral Movements Muscles of Facial Expression: None, normal Lips and  Perioral Area: None, normal Jaw: None, normal Tongue: None, normal, Extremity Movements Upper (arms, wrists, hands, fingers): None, normal Lower (legs, knees, ankles, toes): None, normal, Trunk Movements Neck, shoulders, hips: None, normal,  Overall Severity Severity of abnormal movements (highest score from questions above): None, normal Incapacitation due to abnormal movements: None, normal Patient's awareness of abnormal movements (rate only patient's report): No Awareness,  Dental Status Current problems with teeth and/or dentures?: Yes (multiple cavities and broken teeth) Does patient usually wear dentures?: No  Treatment Plan Summary: Daily contact with patient to assess and evaluate symptoms and progress in treatment Medication  management  Plan: Increase HS trazodone from 50mg , with repeat to 100mg  trazodone at HS, may repeat x 1. Continue other regiment as ordered.   Norval Gable  FNP-BC 09/17/2012, 5:46 PM

## 2012-09-18 NOTE — Progress Notes (Signed)
Patient ID: [name], female   DOB: July 05, 1971, 41 y.o.   MRN: [id number]  D: Patient lying in bed with eyes closed. Appears to be sleeping. Respirations even and non-labored. A: Staff will monitor on q 15 minute checks and follow treatments and give medications as ordered. R: No response from patient at present due to sleeping.

## 2012-09-18 NOTE — Progress Notes (Signed)
Agree and sign.

## 2012-09-18 NOTE — Progress Notes (Signed)
Found pt in dayroom working on puzzle with peer. On 1:1 pt is pleasant and smiling. Denies any issues at this time. Reports mood and anxiety are improving and group has been most helpful. She denies SI/HI/AVH. Medicated per orders. Emotional support given. Pt remains safe, currently in group. Lawrence Marseilles

## 2012-09-18 NOTE — Progress Notes (Signed)
  [name] is a 41 y.o. female [id number] 10-14-1971  09/14/2012 Principal Problem:  *Major depression, recurrent Active Problems:  Posttraumatic stress disorder  Hypertension   Mental Status: Mood is better as she slept through the night. Denies SI/HI/AVH.    Subjective/Objective: Says that because she stays busy here doesn' t hear the voices.    Filed Vitals:   09/18/12 0616  BP: 113/82  Pulse: 97  Temp:   Resp:     Lab Results:   BMET    Component Value Date/Time   NA 136 09/15/2012 0625   K 3.4* 09/15/2012 0625   CL 102 09/15/2012 0625   CO2 25 09/15/2012 0625   GLUCOSE 77 09/15/2012 0625   BUN 15 09/15/2012 0625   CREATININE 0.88 09/15/2012 0625   CALCIUM 9.2 09/15/2012 0625   GFRNONAA 82* 09/15/2012 0625   GFRAA >90 09/15/2012 0625    Medications:  Scheduled:     . asenapine  5 mg Sublingual Q2000  . traZODone  100 mg Oral QHS,MR X 1  . Vilazodone HCl  30 mg Oral Daily     PRN Meds acetaminophen, alum & mag hydroxide-simeth, magnesium hydroxide, DISCONTD: traZODone  Plan : no med changes/adjustments indicated for today.  [name],[name]. 09/18/2012

## 2012-09-18 NOTE — Progress Notes (Signed)
Psychoeducational Group Note  Date:  09/18/2012 Time: 1515  Group Topic/Focus:  Wellness Toolbox:   The focus of this group is to discuss various aspects of wellness, balancing those aspects and exploring ways to increase the ability to experience wellness.  Patients will create a wellness toolbox for use upon discharge.  Participation Level:  Active  Participation Quality:  Appropriate  Affect:  Appropriate  Cognitive:  Appropriate  Insight:  Good  Engagement in Group:  Good  Additional Comments:    Donjuan Robison H 09/18/2012, 4:23 PM

## 2012-09-18 NOTE — Progress Notes (Signed)
Psychoeducational Group Note  Date:  09/18/2012 Time:  1315  Group Topic/Focus:  Developing a Wellness Toolbox:   The focus of this group is to help patients develop a "wellness toolbox" with skills and strategies to promote recovery upon discharge.  Participation Level:  Active  Participation Quality:  Appropriate and Attentive  Affect:  Excited  Cognitive:  Alert and Appropriate  Insight:  Good  Engagement in Group:  Good  Additional Comments:    Arda Daggs, Amil Amen 09/18/2012, 3:20 PM

## 2012-09-18 NOTE — Progress Notes (Signed)
D- Patient out in milieu interacting with peers and attending groups with good participation.  Rates depression and hopelessness at 7 and denies SI.  Affect is brighter than stated numerical rating.  Compliant with scheduled medication and no prn's requested. No physical complaints.  A- Support and encouragement given. 15' checks cont for safety. POC continued with evaluation of treatment goals.  Medication education done.  R- Remains safe on the unit.  Verbalized understanding of all instruction. Appropriate behavior.

## 2012-09-19 MED ORDER — VILAZODONE HCL 20 MG PO TABS
40.0000 mg | ORAL_TABLET | Freq: Every day | ORAL | Status: DC
  Administered 2012-09-20 – 2012-09-21 (×2): 40 mg via ORAL
  Filled 2012-09-19 (×3): qty 2

## 2012-09-19 NOTE — Social Work (Signed)
Aftercare Planning Group: 09/19/2012 9:45 AM  Pt attended discharge planning group and actively participated in group.  SW provided pt with today's workbook.  Pt presents with flat affect and depressed mood.  Pt rates depression at a 7 and anxiety at a 6 today.  Pt denies SI/HI.  Pt states that her blood pressure is low so she doesn't feel well today.  Pt will follow up with [facility] for medication management and therapy.  No further needs voiced by pt at this time.  Safety planning and suicide prevention discussed.  Pt participated in discussion and acknowledged an understanding of the information provided.        [facility] Group Note : Clinical Social Worker Group Therapy  09/19/2012  1:15 PM  Type of Therapy:  Group Therapy  Participation Level:  Appropriate  Participation Quality:  Appropriate   Affect:  Appropriate  Cognitive:  Alert  Insight:  Good  Engagement in Group:  Good  Engagement in Therapy:  Good  Modes of Intervention:  Clarification, Problem-solving, Socialization and Support  Summary of Progress/Problems: The topic for group today was overcoming obstacles.  Pt discussed overcoming obstacles and what this means for pt.  Pt states that she is trying to overcome her negative thoughts and the loss of two kids.  Pt states that she plans to use her positive supports to overcome her depression.       Reyes Ivan, LCSWA 09/19/2012 9:42 AM

## 2012-09-19 NOTE — Progress Notes (Signed)
Pt attended AA meeting this evening.  

## 2012-09-19 NOTE — Progress Notes (Signed)
Patient ID: [name], female   DOB: [birth year], 41 y.o.   MRN: [id number] D: Patient in bed awake on approach. Pt  appears sad stating "her lower back was bothering her last night but feel better this morning". Cooperative with assessment. Pt. reports sleep was "well". Appetite is "good". Pt rates depression  was "8" and hopelessness was "6" out of 10 scale. Denies SI/HI/AV and contract for safety at this time. Offered no additional question or concerns   A: Meet 1:1 with pt. In room. Safety has been maintained with Q15 minutes observation. Supported and encouragement provided.   R: patient attended and participated ingroup. Patient remains safe. she is complaint with medications. Safety has been maintained Q15 and continue current POC.

## 2012-09-19 NOTE — Progress Notes (Signed)
[facility] MD Progress Note  09/19/2012 4:41 PM  Diagnosis:   Axis I: major Depression recurrent. Post Traumatic Stress Disorder Axis II: Deferred Axis III:  Past Medical History  Diagnosis Date  . Depression   . Hypertension   . Anxiety   . Stroke     Pt unclear historian- had "ministrokes"   Axis IV: problems with primary support group Axis V: 51-60 moderate symptoms  ADL's:  Intact  Sleep: Fair  Appetite:  Fair  Suicidal Ideation:  Plan:  Denies Intent:  Denies Means:  Denies Homicidal Ideation:  Plan:  Denies Intent:  Denies Means:  Denies  Still not where she wants to be mood wise. Still some underlying sadness, worry  Mental Status Examination/Evaluation: Objective:  Appearance: Fairly Groomed  Patent attorney::  Fair  Speech:  Normal Rate  Volume:  Normal  Mood:  Anxious and Depressed  Affect:  Appropriate  Thought Process:  Coherent, Goal Directed, Intact and Logical  Orientation:  Full  Thought Content:  WDL  Suicidal Thoughts:  No  Homicidal Thoughts:  No  Memory:  Immediate;   Fair Recent;   Fair Remote;   Fair  Judgement:  Intact  Insight:  Fair  Psychomotor Activity:  Normal  Concentration:  Fair  Recall:  Fair  Akathisia:  No  Handed:  Right  AIMS (if indicated):     Assets:  Desire for Improvement Vocational/Educational  Sleep:  Number of Hours: 6    Vital Signs:Blood pressure 97/67, pulse 91, temperature 98.1 F (36.7 C), temperature source Oral, resp. rate 16, height 4\' 11"  (1.499 m), weight 76.204 kg (168 lb), last menstrual period 08/20/2012. Current Medications: Current Facility-Administered Medications  Medication Dose Route Frequency Provider Last Rate Last Dose  . acetaminophen (TYLENOL) tablet 650 mg  650 mg Oral Q6H PRN Kerry Hough, PA      . alum & mag hydroxide-simeth (MAALOX/MYLANTA) 200-200-20 MG/5ML suspension 30 mL  30 mL Oral Q4H PRN Kerry Hough, PA      . asenapine (SAPHRIS) sublingual tablet 5 mg  5 mg Sublingual  Q2000 Rachael Fee, MD   5 mg at 09/18/12 1951  . magnesium hydroxide (MILK OF MAGNESIA) suspension 30 mL  30 mL Oral Daily PRN Kerry Hough, PA      . traZODone (DESYREL) tablet 100 mg  100 mg Oral QHS,MR X 1 Norval Gable, NP   100 mg at 09/18/12 2211  . Vilazodone HCl TABS 30 mg  30 mg Oral Daily Rachael Fee, MD        Lab Results: No results found for this or any previous visit (from the past 48 hour(s)).  Physical Findings: AIMS: Facial and Oral Movements Muscles of Facial Expression: None, normal Lips and Perioral Area: None, normal Jaw: None, normal Tongue: None, normal,Extremity Movements Upper (arms, wrists, hands, fingers): None, normal Lower (legs, knees, ankles, toes): None, normal, Trunk Movements Neck, shoulders, hips: None, normal, Overall Severity Severity of abnormal movements (highest score from questions above): None, normal Incapacitation due to abnormal movements: None, normal Patient's awareness of abnormal movements (rate only patient's report): No Awareness, Dental Status Current problems with teeth and/or dentures?: Yes (multiple cavities and broken teeth) Does patient usually wear dentures?: No  CIWA:    COWS:     Treatment Plan Summary: Daily contact with patient to assess and evaluate symptoms and progress in treatment Medication management  Plan: Supportive approach/coping skills  Increase the Viibryd to 40 mg daily Alara Daniel A 09/19/2012, 4:41 PM

## 2012-09-19 NOTE — Progress Notes (Signed)
Psychoeducational Group Note  Date:  09/19/2012 Time:  1100  Group Topic/Focus:  Self Care:   The focus of this group is to help patients understand the importance of self-care in order to improve or restore emotional, physical, spiritual, interpersonal, and financial health.  Participation Level:  Active  Participation Quality:  Appropriate and Attentive  Affect:  Appropriate  Cognitive:  Appropriate  Insight:  Good  Engagement in Group:  Good  Additional Comments:  This group is designed to assist the patient in identifying activities that they will incorporate into their daily living with the intention of improving or restoring emotional, physical, spiritual, interpersonal and financial health. The patient was asked to define what "self- care" means to them and review why it is important to take care of self.   Ardelle Park O 09/19/2012, 3:25 PM

## 2012-09-19 NOTE — Tx Team (Addendum)
Interdisciplinary Treatment Plan Update (Adult)  Date:  09/19/2012  Time Reviewed:  10:01 AM   Progress in Treatment: Attending groups: Yes Participating in groups:  Yes Taking medication as prescribed: Yes Tolerating medication:  Yes Family/Significant othe contact made:  Yes Patient understands diagnosis:  Yes Discussing patient identified problems/goals with staff:  Yes Medical problems stabilized or resolved:  Yes Denies suicidal/homicidal ideation: Yes Issues/concerns per patient self-inventory:  None identified Other: N/A  New problem(s) identified: None Identified  Reason for Continuation of Hospitalization: Anxiety Depression Medication stabilization  Interventions implemented related to continuation of hospitalization: mood stabilization, medication monitoring and adjustment, group therapy and psycho education, safety checks q 15 mins  Additional comments: N/A  Estimated length of stay: 2-3 days  Discharge Plan: Pt will return to [facility] for follow up.    New goal(s): N/A  Review of initial/current patient goals per problem list:    1.  Goal(s): Address substance use  Met:  No  Target date: by discharge  As evidenced by: completing detox protocol and refer to appropriate treatment  2.  Goal (s): Reduce depressive and anxiety symptoms  Met:  No  Target date: by discharge  As evidenced by: Reducing depression from a 10 to a 3 as reported by pt.  Pt rates depression at a 7 and anxiety at a 6 today.    3.  Goal(s): Eliminate SI  Met:  Yes  Target date: by discharge  As evidenced by: Pt denies SI.   Attendees: Patient:     Family:     Physician:  Geoffery Lyons, MD 09/19/2012 9:52 AM   Nursing: Roswell Miners, RN 09/19/2012 9:52 AM   Clinical Social Worker:  Reyes Ivan, LCSWA 09/19/2012 9:52 AM   Other: Bubba Camp, Psyc intern 09/19/2012 9:52 AM   Other:  Alease Frame, RN 09/19/2012 9:53 AM   Other:  Jules Schick, RN 09/19/2012 9:53 AM     Other:  Liborio Nixon, RN 09/19/2012 9:53 AM   Other:      Scribe for Treatment Team:   Reyes Ivan 09/19/2012 9:52 AM

## 2012-09-20 NOTE — Social Work (Signed)
Aftercare Planning Group: 09/20/2012 9:45 AM  Pt attended discharge planning group and actively participated in group.  SW provided pt with today's workbook.  Pt presents with flat affect and depressed mood.  Pt rates depression and anxiety a 6-7 today.  Pt denies SI/HI.  Pt will follow up with [facility].  No further needs voiced by pt at this time.    [facility] Group Note : Clinical Social Worker Group Therapy  09/20/2012  1:15 PM  Type of Therapy:  Group Therapy  Participation Level:  Appropriate  Participation Quality:  Appropriate   Affect:  Appropriate  Cognitive:  Alert  Insight:  Good  Engagement in Group:  Good  Engagement in Therapy:  Good  Modes of Intervention:  Clarification, Education, Socialization and Support  Summary of Progress/Problems: Patient was attentive and engaged with speaker from [employer].  Patient expressed interest in their programs and services.  Patient processed ways they can relate to the speaker.      Reyes Ivan, LCSWA 09/20/2012 10:37 AM

## 2012-09-20 NOTE — Progress Notes (Signed)
D:  Patient up and in the milieu today.  Has attended all groups, but is often seen sleeping or sitting in a chair, curled up with her eyes closed.  She is tolerating her medications and her antidepressant was increased from 30 mg to 40 mg today.  Her affect is flat, but she is smiling and interacting more today.  Rates depression at 6 and hopelessness at 5 today.  A:  Medications given as ordered.  Encouraged participation in groups, not just sitting and listening.  Talked about discharge plan with patient and she will be going back to her previous living situation.   R:  Pleasant and cooperative.  Remains sad.  Improved interaction with both staff and peers.

## 2012-09-20 NOTE — Progress Notes (Signed)
Patient ID: [name], female   DOB: 05-Jan-1971, 41 y.o.   MRN: [id number]  D:  Pt was interacting with her peers, playing cards in the med room. Later pt informed the writer that her doctor said she would probably discharge in "2 days". Stated that her med is being increased to 40mg  referring to Vilazodone.  A:  Offered support and encouraged pt. 15 min checks continued for safety.   R: Pt remains safe.

## 2012-09-20 NOTE — Progress Notes (Signed)
[facility] Group Notes:  (Counselor/Nursing/MHT/Case Management/Adjunct)  09/20/2012 12:57 PM  Type of Therapy:  Psychoeducational Skills  Participation Level:  Minimal  Participation Quality:  Drowsy  Affect:  Blunted  Cognitive:  Oriented  Insight:  Limited  Engagement in Group:  Limited  Engagement in Therapy:  n/a  Modes of Intervention:  Activity, Education, Problem-solving, Socialization and Support  Summary of Progress/Problems: Museum/gallery curator attended Psychoeducational group on labels. 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 [name] was falling asleep at times but spoke when called on while group discussed what labels are, how we use them, how they effect our way of thinking, and listed positive and negative labels they have had. [name] was given a homework assignment to list 10 ways she has been labeled and to find the reality of the situation/label.    Wandra Scot 09/20/2012, 12:57 PM
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
## 2012-09-20 NOTE — Progress Notes (Addendum)
Psychoeducational Group Note  Date:  09/20/2012 Time:  1100  Group Topic/Focus:  Recovery Goals:   The focus of this group is to identify appropriate goals for recovery and establish a plan to achieve them.  Participation Level:  Minimal  Participation Quality:  Drowsy  Affect:  Flat  Cognitive:  Appropriate  Insight:  Good  Engagement in Group:  Good  Additional Comments: Patient identified two areas of their life in which they would like to make a change toward recovery.Patient set a specific measurable goal to address each change identified. Patient was instructed to complete homework assignment regarding how this goal impacts their personal recovery.   Ardelle Park O 09/20/2012, 1:16 PM

## 2012-09-20 NOTE — Progress Notes (Signed)
Psychoeducational Group Note  Date:  09/20/2012 Time:  2000 Group Topic/Focus:  AA group  Participation Level:  Active  Participation Quality:  Attentive  Affect:  Appropriate  Cognitive:  Appropriate  Insight:  Good  Engagement in Group:  Good  Additional Comments:    Gwenevere Ghazi Patience 09/20/2012, 9:55 PM

## 2012-09-21 MED ORDER — VILAZODONE HCL 20 MG PO TABS: 40.0000 mg | ORAL_TABLET | Freq: Every day | ORAL | Status: DC

## 2012-09-21 MED ORDER — ASENAPINE MALEATE 5 MG SL SUBL: 5.0000 mg | SUBLINGUAL_TABLET | Freq: Every day | SUBLINGUAL | Status: DC

## 2012-09-21 MED ORDER — TRAZODONE HCL 100 MG PO TABS: 100.0000 mg | ORAL_TABLET | Freq: Every evening | ORAL | Status: DC | PRN
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
## 2012-09-21 NOTE — H&P (Signed)
Agree with assessment and plan Avish Torry A. Majorie Santee, M.D. 

## 2012-09-21 NOTE — Discharge Summary (Signed)
Physician Discharge Summary Note  Patient:  [name] is an 41 y.o., female MRN:  [id number] DOB:  12-12-1970 Patient phone:  [phone] (home)  Patient address:   [address] [place] [address],   Date of Admission:  09/14/2012 Date of Discharge: 09/21/2012  Reason for Admission:  Suicidal, could not contract for safety  Discharge Diagnoses: Principal Problem:  *Major depression, recurrent Active Problems:  Posttraumatic stress disorder   Axis Diagnosis:   AXIS I:  Major Depression, Recurrent severe and Post Traumatic Stress Disorder AXIS II:  Deferred AXIS III:   Past Medical History  Diagnosis Date  . Depression   . Hypertension   . Anxiety   . Stroke     Pt unclear historian- had "ministrokes"   AXIS IV:  other psychosocial or environmental problems and problems related to social environment AXIS V:  61-70 mild symptoms  Level of Care:  IOP  Hospital Course:   Patient attended individual and group therapy while inpatient along with attending therapy groups, one-one time with MD daily, medications for depression managed during inpatient, follow-up appointments made prior to discharge   Consults:  None  Significant Diagnostic Studies:  labs: Completed in ED, reviewed  Discharge Vitals:   Blood pressure 112/76, pulse 96, temperature 98 F (36.7 C), temperature source Oral, resp. rate 16, height 4\' 11"  (1.499 m), weight 76.204 kg (168 lb), last menstrual period 08/20/2012. Lab Results:   No results found for this or any previous visit (from the past 72 hour(s)).  Physical Findings: AIMS: Facial and Oral Movements Muscles of Facial Expression: None, normal Lips and Perioral Area: None, normal Jaw: None, normal Tongue: None, normal,Extremity Movements Upper (arms, wrists, hands, fingers): None, normal Lower (legs, knees, ankles, toes): None, normal, Trunk Movements Neck, shoulders, hips: None, normal, Overall Severity Severity of abnormal  movements (highest score from questions above): None, normal Incapacitation due to abnormal movements: None, normal Patient's awareness of abnormal movements (rate only patient's report): No Awareness, Dental Status Current problems with teeth and/or dentures?: No Does patient usually wear dentures?: No  CIWA:    COWS:     Mental Status Exam: See Mental Status Examination and Suicide Risk Assessment completed by Attending Physician prior to discharge.  Discharge destination:  Home  Is patient on multiple antipsychotic therapies at discharge:  No   Has Patient had three or more failed trials of antipsychotic monotherapy by history:  No Recommended Plan for Multiple Antipsychotic Therapies:  N/A   Discharge Orders    Future Orders Please Complete By Expires   Diet - low sodium heart healthy      Activity as tolerated - No restrictions          Medication List     As of 09/21/2012 10:39 AM    TAKE these medications      Indication    asenapine 5 MG Subl   Commonly known as: SAPHRIS   Place 5 mg under the tongue at bedtime.    Indication: Manic-Depression      traZODone 100 MG tablet   Commonly known as: DESYREL   Take 1 tablet (100 mg total) by mouth at bedtime and may repeat dose one time if needed.    Indication: Trouble Sleeping, Major Depressive Disorder      Vilazodone HCl 20 MG Tabs   Take 2 tablets (40 mg total) by mouth daily.    Indication: Major Depressive Disorder  Follow-up Information    Follow up with [facility].   Contact information:   [address] [place], [place] [address] [phone]         Follow-up recommendations:  Activity as tolerated, low sodium heart healthy diet  Comments:  Patient denied suicidal/homicidal ideations and auditory/visual hallucinations, follow-up appointments encouraged to attend, outside support groups encouraged and information given, patient plans to follow-up with [facility], family  supportive, anxiety coping skills taught with patient information booklets given to her prior to discharge Signed: Nanine Means 09/21/2012, 10:39 AM

## 2012-09-21 NOTE — Progress Notes (Addendum)
[facility] Case Management Discharge Plan:  Will you be returning to the same living situation after discharge: Yes,  returning home At discharge, do you have transportation home?:Yes,  access to transportation Do you have the ability to pay for your medications:Yes,  access to meds   Release of information consent forms completed and in the chart;  Patient's signature needed at discharge.  Patient to Follow up at:  Follow-up Information    Follow up with [facility]. On 09/23/2012. (Resume groups, 1-4 pm)    Contact information:   [address] [place], [place] [address] [phone]         Patient denies SI/HI:   Yes,  denies SI/HI    Safety Planning and Suicide Prevention discussed:  Yes,  discussed with pt today  Barrier to discharge identified:No.  Summary and Recommendations: Pt attended discharge planning group and actively participated in group.  SW provided pt with today's workbook.  Pt presents with calm mood and affect.  Pt rates depression at a 4 and anxiety at a 3 today.  Pt denies SI/HI.  Pt reports feeling stable to d/c today.  No recommendations from SW.  No further needs voiced by pt.  Pt stable to discharge.     Carmina Miller 09/21/2012, 10:17 AM

## 2012-09-21 NOTE — Progress Notes (Signed)
Pt d/c from hospital with her son. All items returned. D/C instructions given, and prescriptions given. Pt denies si and hi.

## 2012-09-21 NOTE — Progress Notes (Signed)
Psychoeducational Group Note  Date:  09/21/2012 Time:  1000  Group Topic/Focus:  aromatherapy  Participation Level:  Active  Participation Quality:  Appropriate  Affect:  Appropriate  Cognitive:  Alert  Insight:  Good  Engagement in Group:  Good  Additional Comments:  Patient participated in group therayp  [name], [name] 09/21/2012, 11:13 AM

## 2012-09-21 NOTE — Progress Notes (Signed)
Patient in bed not sleeping at the beginning of this shift. She endorsed difficulty falling asleep. Writer offered Trazodone 100 mg as ordered for sleep. Patient received medication without difficulty and tolerated. Medication. Patient now resting quietly with eyes closed. Respirations even and unlabored. No distress noted,  Q 15 minute check continues to maintain safety.

## 2012-09-21 NOTE — Tx Team (Signed)
Interdisciplinary Treatment Plan Update (Adult)  Date:  09/21/2012  Time Reviewed:  10:15 AM   Progress in Treatment: Attending groups: Yes Participating in groups:  Yes Taking medication as prescribed: Yes Tolerating medication:  Yes Family/Significant othe contact made:  Yes Patient understands diagnosis:  Yes Discussing patient identified problems/goals with staff:  Yes Medical problems stabilized or resolved:  Yes Denies suicidal/homicidal ideation: Yes Issues/concerns per patient self-inventory:  None identified Other: N/A  New problem(s) identified: None Identified  Reason for Continuation of Hospitalization: Stable to d/c  Interventions implemented related to continuation of hospitalization: Stable to d/c  Additional comments: N/A  Estimated length of stay: D/C today  Discharge Plan: Pt will follow up with [facility] for IOP.    New goal(s): N/A  Review of initial/current patient goals per problem list:    1.  Goal(s): Address substance use  Met:  Yes  Target date: by discharge  As evidenced by: completed detox protocol and referred to appropriate treatment  2.  Goal (s): Reduce depressive and anxiety symptoms  Met:  Yes  Target date: by discharge  As evidenced by: Reducing depression from a 10 to a 3 as reported by pt.  Pt rates depression at a 4 and anxiety at a 3 today.   3.  Goal(s): Eliminate SI  Met:  Yes  Target date: by discharge  As evidenced by: Pt denies SI.    Attendees: Patient:  [name]  09/21/2012 10:15 AM   Family:     Physician:  Geoffery Lyons, MD 09/21/2012 10:15 AM   Nursing: Waynetta Sandy, RN 09/21/2012 10:15 AM   Clinical Social Worker:  Reyes Ivan, LCSWA 09/21/2012 10:15 AM   Other: Celso Amy, NP 09/21/2012 10:15 AM   Other:  Bubba Camp, Psyc intern 09/21/2012 10:17 AM   Other:     Other:     Other:      Scribe for Treatment Team:   Reyes Ivan 09/21/2012 10:15 AM

## 2012-09-21 NOTE — Progress Notes (Signed)
D:Pt reports that she has good family support including her aunt. She states that her mood has improved with no si and that she is wanting to leave today. Pt says that she needs to pay her mortgage and be with her children for Halloween.  A:Supported pt to discuss feelings. Gave medication as ordered and pt is tolerating well. Offered encouragement and 15 minute checks. R:Pt denies si and hi. She denies hallucinations. Safety maintained on the unit.

## 2012-09-23 NOTE — Progress Notes (Signed)
Patient Discharge Instructions:  After Visit Summary (AVS):   Faxed to:  09/23/12 Psychiatric Admission Assessment Note:   Faxed to:  09/23/12 Suicide Risk Assessment - Discharge Assessment:   Faxed to:  09/23/12 Faxed/Sent to the Next Level Care provider:  09/23/12 Faxed to [facility] @ [phone]  Jerelene Redden, 09/23/2012, 3:01 PM

## 2012-10-02 NOTE — Discharge Summary (Signed)
Agree with assessment and plan Kriss Perleberg A. Haywood Meinders, M.D. 

## 2012-12-01 ENCOUNTER — Encounter (HOSPITAL_COMMUNITY): Payer: Self-pay | Admitting: Emergency Medicine

## 2012-12-01 ENCOUNTER — Emergency Department (HOSPITAL_COMMUNITY)
Admission: EM | Admit: 2012-12-01 | Discharge: 2012-12-01 | Disposition: A | Discharge status: Home / Self Care | Payer: Medicaid Other | Attending: Emergency Medicine | Admitting: Emergency Medicine

## 2012-12-01 DIAGNOSIS — L42 Pityriasis rosea: Secondary | ICD-10-CM

## 2012-12-01 DIAGNOSIS — Z79899 Other long term (current) drug therapy: Secondary | ICD-10-CM | POA: Insufficient documentation

## 2012-12-01 DIAGNOSIS — F17200 Nicotine dependence, unspecified, uncomplicated: Secondary | ICD-10-CM | POA: Insufficient documentation

## 2012-12-01 DIAGNOSIS — I10 Essential (primary) hypertension: Secondary | ICD-10-CM | POA: Insufficient documentation

## 2012-12-01 DIAGNOSIS — Z8673 Personal history of transient ischemic attack (TIA), and cerebral infarction without residual deficits: Secondary | ICD-10-CM | POA: Insufficient documentation

## 2012-12-01 DIAGNOSIS — Z8659 Personal history of other mental and behavioral disorders: Secondary | ICD-10-CM | POA: Insufficient documentation

## 2012-12-01 MED ORDER — HYDROCORTISONE 2.5 % EX LOTN: TOPICAL_LOTION | Freq: Two times a day (BID) | CUTANEOUS | Status: DC

## 2012-12-01 MED ORDER — LORATADINE 10 MG PO TABS: 10.0000 mg | ORAL_TABLET | Freq: Every day | ORAL | Status: DC

## 2012-12-01 MED ORDER — DIPHENHYDRAMINE HCL 25 MG PO TABS: 25.0000 mg | ORAL_TABLET | Freq: Four times a day (QID) | ORAL | Status: DC

## 2012-12-01 NOTE — ED Notes (Signed)
Pt c/o rash to abd and back for several days.  States her clothes were recently washed in a different type of detergent.  Pt has been using calamine lotion.  Denies new foods or medications recently.
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
## 2012-12-02 NOTE — ED Provider Notes (Signed)
Medical screening examination/treatment/procedure(s) were performed by non-physician practitioner and as supervising physician I was immediately available for consultation/collaboration.   Miette Molenda, MD 12/02/12 0706 

## 2013-01-04 ENCOUNTER — Other Ambulatory Visit: Payer: Self-pay | Admitting: Obstetrics & Gynecology

## 2013-01-04 DIAGNOSIS — Z1231 Encounter for screening mammogram for malignant neoplasm of breast: Secondary | ICD-10-CM

## 2013-02-01 ENCOUNTER — Ambulatory Visit: Payer: Self-pay

## 2013-02-15 ENCOUNTER — Encounter: Payer: Self-pay | Admitting: Obstetrics & Gynecology

## 2013-02-15 ENCOUNTER — Ambulatory Visit (INDEPENDENT_AMBULATORY_CARE_PROVIDER_SITE_OTHER): Payer: Medicaid Other | Admitting: Obstetrics & Gynecology

## 2013-02-15 VITALS — BP 120/84 | HR 88 | Temp 98.2°F | Ht 59.5 in | Wt 173.0 lb

## 2013-02-15 DIAGNOSIS — G4700 Insomnia, unspecified: Secondary | ICD-10-CM

## 2013-02-15 DIAGNOSIS — Z3202 Encounter for pregnancy test, result negative: Secondary | ICD-10-CM

## 2013-02-15 DIAGNOSIS — Z30430 Encounter for insertion of intrauterine contraceptive device: Secondary | ICD-10-CM

## 2013-02-15 MED ORDER — ZOLPIDEM TARTRATE 5 MG PO TABS: 5.0000 mg | ORAL_TABLET | Freq: Every evening | ORAL | Status: DC | PRN

## 2013-02-15 NOTE — Patient Instructions (Signed)
Levonorgestrel intrauterine device (IUD) What is this medicine? LEVONORGESTREL IUD (LEE voe nor jes trel) is a contraceptive (birth control) device. The device is placed inside the uterus by a healthcare professional. It is used to prevent pregnancy and can also be used to treat heavy bleeding that occurs during your period. Depending on the device, it can be used for 3 to 5 years. This medicine may be used for other purposes; ask your health care provider or pharmacist if you have questions. What should I tell my health care provider before I take this medicine? They need to know if you have any of these conditions: -abnormal Pap smear -cancer of the breast, uterus, or cervix -diabetes -endometritis -genital or pelvic infection now or in the past -have more than one sexual partner or your partner has more than one partner -heart disease -history of an ectopic or tubal pregnancy -immune system problems -IUD in place -liver disease or tumor -problems with blood clots or take blood-thinners -use intravenous drugs -uterus of unusual shape -vaginal bleeding that has not been explained -an unusual or allergic reaction to levonorgestrel, other hormones, silicone, or polyethylene, medicines, foods, dyes, or preservatives -pregnant or trying to get pregnant -breast-feeding How should I use this medicine? This device is placed inside the uterus by a health care professional. Talk to your pediatrician regarding the use of this medicine in children. Special care may be needed. Overdosage: If you think you have taken too much of this medicine contact a poison control center or emergency room at once. NOTE: This medicine is only for you. Do not share this medicine with others. What if I miss a dose? This does not apply. What may interact with this medicine? Do not take this medicine with any of the following medications: -amprenavir -bosentan -fosamprenavir This medicine may also interact with  the following medications: -aprepitant -barbiturate medicines for inducing sleep or treating seizures -bexarotene -griseofulvin -medicines to treat seizures like carbamazepine, ethotoin, felbamate, oxcarbazepine, phenytoin, topiramate -modafinil -pioglitazone -rifabutin -rifampin -rifapentine -some medicines to treat HIV infection like atazanavir, indinavir, lopinavir, nelfinavir, tipranavir, ritonavir -St. John's wort -warfarin This list may not describe all possible interactions. Give your health care provider a list of all the medicines, herbs, non-prescription drugs, or dietary supplements you use. Also tell them if you smoke, drink alcohol, or use illegal drugs. Some items may interact with your medicine. What should I watch for while using this medicine? Visit your doctor or health care professional for regular check ups. See your doctor if you or your partner has sexual contact with others, becomes HIV positive, or gets a sexual transmitted disease. This product does not protect you against HIV infection (AIDS) or other sexually transmitted diseases. You can check the placement of the IUD yourself by reaching up to the top of your vagina with clean fingers to feel the threads. Do not pull on the threads. It is a good habit to check placement after each menstrual period. Call your doctor right away if you feel more of the IUD than just the threads or if you cannot feel the threads at all. The IUD may come out by itself. You may become pregnant if the device comes out. If you notice that the IUD has come out use a backup birth control method like condoms and call your health care provider. Using tampons will not change the position of the IUD and are okay to use during your period. What side effects may I notice from receiving this medicine?   Side effects that you should report to your doctor or health care professional as soon as possible: -allergic reactions like skin rash, itching or  hives, swelling of the face, lips, or tongue -fever, flu-like symptoms -genital sores -high blood pressure -no menstrual period for 6 weeks during use -pain, swelling, warmth in the leg -pelvic pain or tenderness -severe or sudden headache -signs of pregnancy -stomach cramping -sudden shortness of breath -trouble with balance, talking, or walking -unusual vaginal bleeding, discharge -yellowing of the eyes or skin Side effects that usually do not require medical attention (report to your doctor or health care professional if they continue or are bothersome): -acne -breast pain -change in sex drive or performance -changes in weight -cramping, dizziness, or faintness while the device is being inserted -headache -irregular menstrual bleeding within first 3 to 6 months of use -nausea This list may not describe all possible side effects. Call your doctor for medical advice about side effects. You may report side effects to FDA at 1-800-FDA-1088. Where should I keep my medicine? This does not apply. NOTE: This sheet is a summary. It may not cover all possible information. If you have questions about this medicine, talk to your doctor, pharmacist, or health care provider.  2013, [employer]/Gold Standard. (12/10/2011 1:54:04 PM)  

## 2013-02-15 NOTE — Progress Notes (Signed)
IUD Insertion Procedure Note  Pre-operative Diagnosis: Desires IUD  Post-operative Diagnosis: same  Indications: contraception  Procedure Details  Urine pregnancy test was done  and result was negative.  The risks (including infection, bleeding, pain, and uterine perforation) and benefits of the procedure were explained to the patient and Verbal informed consent was obtained.    Cervix cleansed with Betadine. Uterus sounded to 8 cm. IUD inserted without difficulty. String visible and trimmed. Patient tolerated procedure well.  IUD Information: Mirena.  Condition: Stable  Complications: None  Plan:  The patient was advised to call for any fever or for prolonged or severe pain or bleeding. She was advised to use OTC analgesics as needed for mild to moderate pain.

## 2013-03-30 ENCOUNTER — Ambulatory Visit (INDEPENDENT_AMBULATORY_CARE_PROVIDER_SITE_OTHER): Payer: Medicaid Other | Admitting: Obstetrics & Gynecology

## 2013-03-30 ENCOUNTER — Encounter: Payer: Self-pay | Admitting: Obstetrics & Gynecology

## 2013-03-30 VITALS — BP 126/87 | HR 81 | Temp 98.1°F | Ht 60.0 in | Wt 178.6 lb

## 2013-03-30 DIAGNOSIS — Z30431 Encounter for routine checking of intrauterine contraceptive device: Secondary | ICD-10-CM | POA: Insufficient documentation

## 2013-03-30 NOTE — Progress Notes (Signed)
.   Subjective:     [name] is a 42 y.o. female here for a follow up for her IUD insertion 02/15/13.  No current complaints.  Personal health questionnaire reviewed: no.   Gynecologic History Patient's last menstrual period was 03/08/2013. Contraception: IUD Last Pap:2011 . Results were: normal Last mammogram: N/A  Obstetric History OB History   Grav Para Term Preterm Abortions TAB SAB Ect Mult Living   6 5 4  1 1    4      # Outc Date GA Lbr Len/2nd Wgt Sex Del Anes PTL Lv   1 TRM 10/90 [redacted]w[redacted]d   M SVD   Yes   2 TRM 5/95 [redacted]w[redacted]d   M SVD   Yes   3 TRM 12/99 [redacted]w[redacted]d   M SVD   Yes   4 PAR 5/04    M SVD   SB   5 TRM 10/05 [redacted]w[redacted]d   F SVD   Yes   6 TAB 5/13               The following portions of the patient's history were reviewed and updated as appropriate: allergies, current medications, past family history, past medical history, past social history, past surgical history and problem list.  Review of Systems Pertinent items are noted in HPI.    Objective:   SPEC:  IUD strings seen; thin, white discharge.  Uterus AV, NT, adnexa NT  Assessment:    Normal female exam s/p IUD insertion   Plan:   Return prn

## 2013-03-30 NOTE — Patient Instructions (Signed)

## 2013-07-26 ENCOUNTER — Encounter: Payer: Self-pay | Admitting: Obstetrics & Gynecology

## 2013-10-06 ENCOUNTER — Ambulatory Visit (INDEPENDENT_AMBULATORY_CARE_PROVIDER_SITE_OTHER): Payer: Medicaid Other | Admitting: Pulmonary Disease

## 2013-10-06 ENCOUNTER — Encounter: Payer: Self-pay | Admitting: Pulmonary Disease

## 2013-10-06 VITALS — BP 112/84 | HR 97 | Temp 98.1°F | Ht 59.5 in | Wt 192.0 lb

## 2013-10-06 DIAGNOSIS — G4710 Hypersomnia, unspecified: Secondary | ICD-10-CM

## 2013-10-06 NOTE — Progress Notes (Signed)
Subjective:    Patient ID: [name], female    DOB: 10-30-1971, 42 y.o.   MRN: [id number]  HPI The patient is a 42 year old female who been asked to see for possible obstructive sleep apnea.  The patient has severe daytime hypersomnia, but also takes medications that can contribute to this.  She has been noted to have loud snoring, as well as an abnormal breathing pattern during sleep.  She has frequent awakenings at night unless she takes significant sedating meds, and has not rested in the mornings upon arising.  She notes definite sleepiness during the day with activity, and will take frequent naps.  She has some sleepiness in the evenings, and intermittent sleepiness with driving.  The patient states her weight is up 50 pounds over the last 2 years, and her Epworth score today is 14.     Sleep Questionnaire What time do you typically go to bed?( Between what hours) 9-11 9-11 at 1335 on 10/06/13 by Maisie Fus, CMA How long does it take you to fall asleep? with medication, within minutes with medication, within minutes at 1335 on 10/06/13 by Maisie Fus, CMA How many times during the night do you wake up? 11 with medication--MORE without meds at 1335 on 10/06/13 by Maisie Fus, CMA What time do you get out of bed to start your day? 0530 0530 at 1335 on 10/06/13 by Maisie Fus, CMA Do you drive or operate heavy machinery in your occupation? No No at 1335 on 10/06/13 by Maisie Fus, CMA How much has your weight changed (up or down) over the past two years? (In pounds) 45 lb (20.412 kg)45 lb (20.412 kg) increase at 1335 on 10/06/13 by Maisie Fus, CMA Have you ever had a sleep study before? No No at 1335 on 10/06/13 by Maisie Fus, CMA Do you currently use CPAP? No No at 1335 on 10/06/13 by Maisie Fus, CMA Do you wear oxygen at any time? No No at 1335 on 10/06/13 by Maisie Fus, CMA   Review of Systems  Constitutional: Positive for  appetite change and unexpected weight change. Negative for fever.  HENT: Positive for congestion, dental problem, ear pain and sneezing. Negative for nosebleeds, postnasal drip, rhinorrhea, sinus pressure, sore throat and trouble swallowing.   Eyes: Negative for redness and itching.  Respiratory: Positive for shortness of breath. Negative for cough, chest tightness and wheezing.   Cardiovascular: Positive for chest pain. Negative for palpitations and leg swelling.  Gastrointestinal: Negative for nausea and vomiting.       Acid heartburn  Genitourinary: Negative for dysuria.  Musculoskeletal: Positive for arthralgias and joint swelling.  Skin: Positive for rash ( ithcing).  Neurological: Positive for headaches.  Hematological: Does not bruise/bleed easily.  Psychiatric/Behavioral: Positive for dysphoric mood. The patient is nervous/anxious.        Objective:   Physical Exam Constitutional:  Obese female, no acute distress  HENT:  Nares patent without discharge  Oropharynx without exudate, palate and uvula are moderately elongated.   Eyes:  Perrla, eomi, no scleral icterus  Neck:  No JVD, no TMG  Cardiovascular:  Normal rate, regular rhythm, no rubs or gallops.  No murmurs        Intact distal pulses  Pulmonary :  Normal breath sounds, no stridor or respiratory distress   No rales, rhonchi, or wheezing  Abdominal:  Soft, nondistended, bowel sounds present.  No tenderness noted.   Musculoskeletal:  No lower  extremity edema noted.  Lymph Nodes:  No cervical lymphadenopathy noted  Skin:  No cyanosis noted  Neurologic:  Appears sleepy, but appropriate, moves all 4 extremities without obvious deficit.         Assessment & Plan:

## 2013-10-06 NOTE — Assessment & Plan Note (Signed)
The patient has significant daytime hypersomnia, along with significantly abnormal sleep during the night.  Her history is very suggestive of sleep disordered breathing, but she is also on significant medications which can result in sedation/sleepiness.  Her depression may also be playing a role in her sleep disruption as well.  At this point, I think she needs to have a sleep study for diagnosis.  The patient is agreeable to this approach.  In the meantime, I have cautioned her about driving while being sleepy, and reminded her of her moral responsibility to avoid doing so.

## 2013-10-06 NOTE — Patient Instructions (Signed)
Will set up for a sleep study, and will arrange for followup once the results are available. Do not drive if you are sleepy.

## 2013-11-05 ENCOUNTER — Ambulatory Visit (HOSPITAL_BASED_OUTPATIENT_CLINIC_OR_DEPARTMENT_OTHER): Payer: Medicaid Other | Attending: Pulmonary Disease | Admitting: Radiology

## 2013-11-05 VITALS — Ht 59.0 in | Wt 190.0 lb

## 2013-11-05 DIAGNOSIS — G4710 Hypersomnia, unspecified: Secondary | ICD-10-CM

## 2013-11-05 DIAGNOSIS — G4733 Obstructive sleep apnea (adult) (pediatric): Secondary | ICD-10-CM

## 2013-11-10 DIAGNOSIS — G4733 Obstructive sleep apnea (adult) (pediatric): Secondary | ICD-10-CM

## 2013-11-10 DIAGNOSIS — G4710 Hypersomnia, unspecified: Secondary | ICD-10-CM

## 2013-11-10 NOTE — Sleep Study (Signed)
   NAME: [name] DATE OF BIRTH:  1971/01/13 MEDICAL RECORD NUMBER [id number]  LOCATION: [facility]  PHYSICIAN: Barbaraann Share  DATE OF STUDY: 11/05/2013  SLEEP STUDY TYPE: Nocturnal Polysomnogram               REFERRING PHYSICIAN: Barbaraann Share, MD  INDICATION FOR STUDY: Hypersomnia with sleep apnea.  EPWORTH SLEEPINESS SCORE:  18 HEIGHT: 4\' 11"  (149.9 cm)  WEIGHT: 190 lb (86.183 kg)    Body mass index is 38.35 kg/(m^2).  NECK SIZE: 15 in.  MEDICATIONS:   SLEEP ARCHITECTURE: The patient had a total sleep time of 430 minutes, with no slow-wave sleep and only 13 minutes of REM. Sleep onset latency was very rapid at 0.5 minutes, and REM onset was quite prolonged at 384 minutes. Sleep efficiency was excellent at 98%.  RESPIRATORY DATA: The patient was found to have 2 apneas and only 10 obstructive hypopneas, giving her an apnea hypopnea index of only 1.7 per hour. The events occurred in all body positions, and there was loud snoring noted throughout.  OXYGEN DATA: There was oxygen desaturation as low as 85% with the patient's obstructive events  CARDIAC DATA: No clinically significant arrhythmias were seen  MOVEMENT/PARASOMNIA: There were no leg jerks or other abnormal behaviors noted.  IMPRESSION/ RECOMMENDATION:    1) small numbers of obstructive events which do not meet the AHI criteria for the obstructive sleep apnea syndrome. The patient had very little REM during the night, but had more than adequate opportunity to exhibit clinically significant sleep disordered breathing.     Barbaraann Share Diplomate, American Board of Sleep Medicine  ELECTRONICALLY SIGNED ON:  11/10/2013, 6:17 PM [facility] PH: [phone]   FX: [phone] ACCREDITED BY THE AMERICAN ACADEMY OF SLEEP MEDICINE

## 2013-11-13 ENCOUNTER — Telehealth: Payer: Self-pay | Admitting: Pulmonary Disease

## 2013-11-13 NOTE — Telephone Encounter (Signed)
Please let pt know that her sleep study does not show sleep apnea.  Could not find any other reason for her sleepiness during the day.  Suspect her medications are contributing to her symptoms, and possibly her depression.  She did have loud snoring, and aggressive weight loss should resolve this.

## 2013-11-13 NOTE — Telephone Encounter (Signed)
Results have been explained to patient, pt expressed understanding. Nothing further needed.  

## 2014-09-24 ENCOUNTER — Encounter: Payer: Self-pay | Admitting: Pulmonary Disease

## 2014-11-08 ENCOUNTER — Emergency Department (HOSPITAL_COMMUNITY)
Admission: EM | Admit: 2014-11-08 | Discharge: 2014-11-08 | Disposition: A | Discharge status: Home / Self Care | Payer: Medicaid Other | Attending: Emergency Medicine | Admitting: Emergency Medicine

## 2014-11-08 ENCOUNTER — Encounter (HOSPITAL_COMMUNITY): Payer: Self-pay | Admitting: *Deleted

## 2014-11-08 DIAGNOSIS — Z720 Tobacco use: Secondary | ICD-10-CM | POA: Insufficient documentation

## 2014-11-08 DIAGNOSIS — I10 Essential (primary) hypertension: Secondary | ICD-10-CM | POA: Insufficient documentation

## 2014-11-08 DIAGNOSIS — Z79899 Other long term (current) drug therapy: Secondary | ICD-10-CM | POA: Insufficient documentation

## 2014-11-08 DIAGNOSIS — A5901 Trichomonal vulvovaginitis: Secondary | ICD-10-CM | POA: Diagnosis not present

## 2014-11-08 DIAGNOSIS — N390 Urinary tract infection, site not specified: Secondary | ICD-10-CM

## 2014-11-08 DIAGNOSIS — F329 Major depressive disorder, single episode, unspecified: Secondary | ICD-10-CM | POA: Insufficient documentation

## 2014-11-08 DIAGNOSIS — F419 Anxiety disorder, unspecified: Secondary | ICD-10-CM | POA: Diagnosis not present

## 2014-11-08 DIAGNOSIS — Z3202 Encounter for pregnancy test, result negative: Secondary | ICD-10-CM | POA: Insufficient documentation

## 2014-11-08 DIAGNOSIS — Z8781 Personal history of (healed) traumatic fracture: Secondary | ICD-10-CM | POA: Insufficient documentation

## 2014-11-08 DIAGNOSIS — A599 Trichomoniasis, unspecified: Secondary | ICD-10-CM

## 2014-11-08 DIAGNOSIS — Z8673 Personal history of transient ischemic attack (TIA), and cerebral infarction without residual deficits: Secondary | ICD-10-CM | POA: Diagnosis not present

## 2014-11-08 DIAGNOSIS — N898 Other specified noninflammatory disorders of vagina: Secondary | ICD-10-CM | POA: Diagnosis present

## 2014-11-08 LAB — URINE MICROSCOPIC-ADD ON

## 2014-11-08 LAB — GC/CHLAMYDIA PROBE AMP
CT Probe RNA: NEGATIVE
GC Probe RNA: NEGATIVE

## 2014-11-08 LAB — URINALYSIS, ROUTINE W REFLEX MICROSCOPIC
BILIRUBIN URINE: NEGATIVE
Glucose, UA: NEGATIVE mg/dL
Ketones, ur: NEGATIVE mg/dL
NITRITE: NEGATIVE
Protein, ur: 100 mg/dL — AB
Specific Gravity, Urine: 1.016 (ref 1.005–1.030)
UROBILINOGEN UA: 1 mg/dL (ref 0.0–1.0)
pH: 6.5 (ref 5.0–8.0)

## 2014-11-08 LAB — WET PREP, GENITAL
Clue Cells Wet Prep HPF POC: NONE SEEN
TRICH WET PREP: NONE SEEN
WBC, Wet Prep HPF POC: NONE SEEN
Yeast Wet Prep HPF POC: NONE SEEN

## 2014-11-08 LAB — PREGNANCY, URINE: PREG TEST UR: NEGATIVE

## 2014-11-08 MED ORDER — CIPROFLOXACIN HCL 500 MG PO TABS: 500.0000 mg | ORAL_TABLET | Freq: Two times a day (BID) | ORAL | Status: DC

## 2014-11-08 MED ORDER — METRONIDAZOLE 500 MG PO TABS: 500.0000 mg | ORAL_TABLET | Freq: Two times a day (BID) | ORAL | Status: DC

## 2014-11-08 NOTE — ED Notes (Signed)
Pt reports vaginal itching and pressure when urinating.  Denies dysuria or discharge at this time.  Pt reports this has been going on for 2 days.
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
## 2014-11-08 NOTE — Discharge Instructions (Signed)
You have been treated in the emergency department for an infection, possibly sexually transmitted. Results of your gonorrhea and chlamydia tests are pending and you will be notified if they are positive. It is very important to practice safe sex and use condoms when sexually active. If your results are positive you need to notify all sexual partners so they can be treated as well. The website http://www.dontspreadit.com/ can be used to send anonymous text messages or emails to alert sexual contacts. Follow up with your doctor, or OBGYN in regards to today's visit.   ° °Gonorrhea and Chlamydia °SYMPTOMS  °In females, symptoms may go unnoticed. Symptoms that are more noticeable can include:  °Belly (abdominal) pain.  °Painful intercourse.  °Watery mucous-like discharge from the vagina.  °Miscarriage.  °Discomfort when urinating.  °Inflammation of the rectum.  °Abnormal gray-green frothy vaginal discharge  °Vaginal itching and irritatio  °Itching and irritation of the area outside the vagina.   °Painful urination.  °Bleeding after sexual intercourse.  °In males, symptoms include:  °Burning with urination.  °Pain in the testicles.  °Watery mucous-like discharge from the penis.  °It can cause longstanding (chronic) pelvic pain after frequent infections.  °TREATMENT  °PID can cause women to not be able to have children (sterile) if left untreated or if half-treated.  It is important to finish ALL medications given to you.  °This is a sexually transmitted infection. So you are also at risk for other sexually transmitted diseases, including HIV (AIDS), it is recommended that you get tested. °HOME CARE INSTRUCTIONS  °Warning: This infection is contagious. Do not have sex until treatment is completed. Follow up at your caregiver's office or the clinic to which you were referred. If your diagnosis (learning what is wrong) is confirmed by culture or some other method, your recent sexual contacts need treatment. Even if they are  symptom free or have a negative culture or evaluation, they should be treated.  °PREVENTION  °Women should use sanitary pads instead of tampons for vaginal discharge.  °Wipe front to back after using the toilet and avoid douching.   °Practice safe sex, use condoms, have only one sex partner and be sure your sex partner is not having sex with others.  °Ask your caregiver to test you for chlamydia at your regular checkups or sooner if you are having symptoms.  °Ask for further information if you are pregnant.  °SEEK IMMEDIATE MEDICAL CARE IF:  °You develop an oral temperature above 102° F (38.9° C), not controlled by medications or lasting more than 2 days.  °You develop an increase in pain.  °You develop any type of abnormal discharge.  °You develop vaginal bleeding and it is not time for your period.  °You develop painful intercourse.  ° °Bacterial Vaginosis  °Bacterial vaginosis (BV) is a vaginal infection where the normal balance of bacteria in the vagina is disrupted. This is not a sexually transmitted disease and your sexual partners do NOT need to be treated. °CAUSES  °The cause of BV is not fully understood. BV develops when there is an increase or imbalance of harmful bacteria.  °Some activities or behaviors can upset the normal balance of bacteria in the vagina and put women at increased risk including:  °Having a new sex partner or multiple sex partners.  °Douching.  °Using an intrauterine device (IUD) for contraception.  °It is not clear what role sexual activity plays in the development of BV. However, women that have never had sexual intercourse are rarely   infected with BV.  °Women do not get BV from toilet seats, bedding, swimming pools or from touching objects around them.  ° °SYMPTOMS  °Grey vaginal discharge.  °A fish-like odor with discharge, especially after sexual intercourse.  °Itching or burning of the vagina and vulva.  °Burning or pain with urination.  °Some women have no signs or symptoms at  all.  ° °TREATMENT  °Sometimes BV will clear up without treatment.  °BV may be treated with antibiotics.  °BV can recur after treatment. If this happens, a second round of antibiotics will often be prescribed.  °HOME CARE INSTRUCTIONS  °Finish all medication as directed by your caregiver.  °Do not have sex until treatment is completed.  °Do NOT drink any alcoholic beverages while being treated  with Metronidazole (Flagyl). This will cause a severe reaction inducing vomiting. ° °RESOURCE GUIDE ° °Dental Problems ° °Patients with Medicaid: °[facility]                     [facility] °[address]                                           [address] °Phone:  [phone]                                                  Phone:  510-2600 ° °If unable to pay or uninsured, contact:  [facility] or [facility] to become qualified for the adult dental clinic. ° °Chronic Pain Problems °Contact [facility]  [phone] °Patients need to be referred by their primary care doctor. ° °Insufficient Money for Medicine °Contact [employer]:  call "211" or [facility] 271-5999. ° °No Primary Care Doctor °Call [facility]  832-8000 °Other agencies that provide inexpensive medical care °   [facility]  [phone] °   [facility]  832-7272 °   [facility]  271-5999 °   [facility]  832-4777 °   [facility]  373-0678 °   [facility]  [phone] ° °Psychological Services °[facility]  [phone] °[facility]  [phone] °[facility]   [phone] (emergency services 641-4993) ° °Substance Abuse Resources °[facility]  336-882-2125 °[facility] 336-784-9470 °[facility] 336-285-9073 °[facility] 336-845-3988 °Residential & Outpatient Substance Abuse Program  800-659-3381 ° °Abuse/Neglect °[place] [facility] (336)  641-3795 °[place] [facility] 800-378-5315 (After Hours) ° °Emergency Shelter °[facility] (336) 271-5985 ° °Maternity Homes °[facility] (336) 275-9566 °[facility] (704) 372-4663 ° °MRSA Hotline #:   832-7006 ° ° ° °[place] [facility] ° °[facility] [facility]     [employer]                          [facility] °[address] [place]                       [address]      [address]  °  [place]                                                [place]                            [place] °Phone:  349-3220                                   Phone:  [phone]                 Phone:  342-8140 ° °[facility] °Phone:  342-8316 ° °[place] [facility] °(336) 342-1394 °(336) 342-3537 (After Hours) ° °

## 2014-12-25 ENCOUNTER — Encounter: Payer: Self-pay | Admitting: Obstetrics

## 2014-12-25 ENCOUNTER — Ambulatory Visit (INDEPENDENT_AMBULATORY_CARE_PROVIDER_SITE_OTHER): Payer: Medicaid Other | Admitting: Obstetrics

## 2014-12-25 VITALS — BP 123/90 | HR 93 | Wt 182.0 lb

## 2014-12-25 DIAGNOSIS — Z01419 Encounter for gynecological examination (general) (routine) without abnormal findings: Secondary | ICD-10-CM

## 2014-12-25 DIAGNOSIS — Z0000 Encounter for general adult medical examination without abnormal findings: Secondary | ICD-10-CM

## 2014-12-25 DIAGNOSIS — Z1239 Encounter for other screening for malignant neoplasm of breast: Secondary | ICD-10-CM

## 2014-12-25 DIAGNOSIS — Z975 Presence of (intrauterine) contraceptive device: Secondary | ICD-10-CM

## 2014-12-25 DIAGNOSIS — Z30431 Encounter for routine checking of intrauterine contraceptive device: Secondary | ICD-10-CM
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
## 2014-12-26 ENCOUNTER — Other Ambulatory Visit: Payer: Self-pay | Admitting: Obstetrics

## 2014-12-26 ENCOUNTER — Ambulatory Visit (INDEPENDENT_AMBULATORY_CARE_PROVIDER_SITE_OTHER): Payer: Medicaid Other

## 2014-12-26 ENCOUNTER — Other Ambulatory Visit: Payer: Medicaid Other

## 2014-12-26 ENCOUNTER — Other Ambulatory Visit: Payer: Self-pay | Admitting: *Deleted

## 2014-12-26 DIAGNOSIS — Z113 Encounter for screening for infections with a predominantly sexual mode of transmission: Secondary | ICD-10-CM

## 2014-12-26 DIAGNOSIS — Z30431 Encounter for routine checking of intrauterine contraceptive device: Secondary | ICD-10-CM

## 2014-12-26 DIAGNOSIS — Z975 Presence of (intrauterine) contraceptive device: Secondary | ICD-10-CM

## 2014-12-26 LAB — PAP IG AND HPV HIGH-RISK: HPV DNA High Risk: DETECTED — AB

## 2014-12-26 NOTE — Addendum Note (Signed)
Addended by: Ladona Ridgel on: 12/26/2014 01:13 PM   Modules accepted: Orders

## 2014-12-27 ENCOUNTER — Ambulatory Visit (HOSPITAL_COMMUNITY)
Admission: RE | Admit: 2014-12-27 | Discharge: 2014-12-27 | Disposition: A | Discharge status: Home / Self Care | Payer: Medicaid Other | Source: Ambulatory Visit | Attending: Obstetrics | Admitting: Obstetrics

## 2014-12-27 ENCOUNTER — Other Ambulatory Visit: Payer: Self-pay | Admitting: Obstetrics

## 2014-12-27 DIAGNOSIS — Z1231 Encounter for screening mammogram for malignant neoplasm of breast: Secondary | ICD-10-CM | POA: Diagnosis present

## 2014-12-27 DIAGNOSIS — Z1239 Encounter for other screening for malignant neoplasm of breast: Secondary | ICD-10-CM

## 2014-12-27 LAB — RPR

## 2014-12-27 LAB — HIV ANTIBODY (ROUTINE TESTING W REFLEX): HIV 1&2 Ab, 4th Generation: NONREACTIVE

## 2014-12-27 LAB — HEPATITIS C ANTIBODY: HCV Ab: NEGATIVE

## 2014-12-27 LAB — HEPATITIS B SURFACE ANTIGEN: Hepatitis B Surface Ag: NEGATIVE

## 2014-12-28 ENCOUNTER — Other Ambulatory Visit: Payer: Self-pay | Admitting: Obstetrics

## 2014-12-28 DIAGNOSIS — A5901 Trichomonal vulvovaginitis: Secondary | ICD-10-CM

## 2014-12-28 DIAGNOSIS — B3731 Acute candidiasis of vulva and vagina: Secondary | ICD-10-CM

## 2014-12-28 DIAGNOSIS — B9689 Other specified bacterial agents as the cause of diseases classified elsewhere: Secondary | ICD-10-CM

## 2014-12-28 DIAGNOSIS — N760 Acute vaginitis: Principal | ICD-10-CM

## 2014-12-28 DIAGNOSIS — B373 Candidiasis of vulva and vagina: Secondary | ICD-10-CM

## 2014-12-28 LAB — SURESWAB, VAGINOSIS/VAGINITIS PLUS
Atopobium vaginae: 7 Log (cells/mL)
BV CATEGORY: UNDETERMINED — AB
C. TRACHOMATIS RNA, TMA: NOT DETECTED
C. albicans, DNA: DETECTED — AB
C. glabrata, DNA: NOT DETECTED
C. parapsilosis, DNA: NOT DETECTED
C. tropicalis, DNA: NOT DETECTED
Gardnerella vaginalis: >8 Log (cells/mL)
LACTOBACILLUS SPECIES: 6.3 Log (cells/mL)
MEGASPHAERA SPECIES: NOT DETECTED Log (cells/mL)
N. gonorrhoeae RNA, TMA: NOT DETECTED
T. vaginalis RNA, QL TMA: DETECTED — AB

## 2014-12-28 MED ORDER — TINIDAZOLE 500 MG PO TABS: 2.0000 g | ORAL_TABLET | Freq: Every day | ORAL | Status: DC

## 2014-12-28 MED ORDER — FLUCONAZOLE 150 MG PO TABS: 150.0000 mg | ORAL_TABLET | Freq: Once | ORAL | Status: DC

## 2015-05-10 ENCOUNTER — Telehealth: Payer: Self-pay | Admitting: *Deleted

## 2015-05-10 NOTE — Telephone Encounter (Signed)
Patient believes she may have Trichomoniasis. Patient has been scheduled for an appointment on 05-14-15.

## 2015-05-14 ENCOUNTER — Ambulatory Visit (INDEPENDENT_AMBULATORY_CARE_PROVIDER_SITE_OTHER): Payer: Medicaid Other | Admitting: Certified Nurse Midwife

## 2015-05-14 ENCOUNTER — Encounter: Payer: Self-pay | Admitting: Certified Nurse Midwife

## 2015-05-14 VITALS — BP 138/89 | HR 69 | Temp 98.3°F | Wt 185.8 lb

## 2015-05-14 DIAGNOSIS — N760 Acute vaginitis: Secondary | ICD-10-CM | POA: Diagnosis not present
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
## 2015-05-17 ENCOUNTER — Other Ambulatory Visit: Payer: Self-pay | Admitting: Obstetrics

## 2015-05-17 DIAGNOSIS — N760 Acute vaginitis: Principal | ICD-10-CM

## 2015-05-17 DIAGNOSIS — B9689 Other specified bacterial agents as the cause of diseases classified elsewhere: Secondary | ICD-10-CM

## 2015-05-17 LAB — SURESWAB, VAGINOSIS/VAGINITIS PLUS
Atopobium vaginae: 7.5 Log (cells/mL)
BV CATEGORY: UNDETERMINED — AB
C. GLABRATA, DNA: NOT DETECTED
C. PARAPSILOSIS, DNA: NOT DETECTED
C. albicans, DNA: NOT DETECTED
C. trachomatis RNA, TMA: NOT DETECTED
C. tropicalis, DNA: NOT DETECTED
Gardnerella vaginalis: >8 Log (cells/mL)
LACTOBACILLUS SPECIES: 5.4 Log (cells/mL)
MEGASPHAERA SPECIES: >8 Log (cells/mL)
N. gonorrhoeae RNA, TMA: NOT DETECTED
T. vaginalis RNA, QL TMA: NOT DETECTED

## 2015-05-17 MED ORDER — TINIDAZOLE 500 MG PO TABS: 1000.0000 mg | ORAL_TABLET | Freq: Every day | ORAL | Status: DC

## 2015-05-29 ENCOUNTER — Encounter (HOSPITAL_COMMUNITY): Payer: Self-pay

## 2015-05-29 ENCOUNTER — Emergency Department (HOSPITAL_COMMUNITY)
Admission: EM | Admit: 2015-05-29 | Discharge: 2015-05-29 | Disposition: A | Discharge status: Home / Self Care | Payer: Medicaid Other | Attending: Emergency Medicine | Admitting: Emergency Medicine

## 2015-05-29 DIAGNOSIS — R200 Anesthesia of skin: Secondary | ICD-10-CM | POA: Diagnosis not present

## 2015-05-29 DIAGNOSIS — R110 Nausea: Secondary | ICD-10-CM | POA: Diagnosis not present

## 2015-05-29 DIAGNOSIS — R0602 Shortness of breath: Secondary | ICD-10-CM | POA: Insufficient documentation

## 2015-05-29 DIAGNOSIS — F329 Major depressive disorder, single episode, unspecified: Secondary | ICD-10-CM | POA: Insufficient documentation

## 2015-05-29 DIAGNOSIS — F419 Anxiety disorder, unspecified: Secondary | ICD-10-CM | POA: Insufficient documentation

## 2015-05-29 DIAGNOSIS — Z720 Tobacco use: Secondary | ICD-10-CM | POA: Diagnosis not present

## 2015-05-29 DIAGNOSIS — R6889 Other general symptoms and signs: Secondary | ICD-10-CM

## 2015-05-29 DIAGNOSIS — R7989 Other specified abnormal findings of blood chemistry: Secondary | ICD-10-CM | POA: Diagnosis not present

## 2015-05-29 DIAGNOSIS — M791 Myalgia: Secondary | ICD-10-CM | POA: Insufficient documentation

## 2015-05-29 DIAGNOSIS — Z3202 Encounter for pregnancy test, result negative: Secondary | ICD-10-CM | POA: Insufficient documentation

## 2015-05-29 DIAGNOSIS — Z79899 Other long term (current) drug therapy: Secondary | ICD-10-CM | POA: Diagnosis not present

## 2015-05-29 DIAGNOSIS — M7989 Other specified soft tissue disorders: Secondary | ICD-10-CM | POA: Insufficient documentation

## 2015-05-29 DIAGNOSIS — Z8673 Personal history of transient ischemic attack (TIA), and cerebral infarction without residual deficits: Secondary | ICD-10-CM | POA: Diagnosis not present

## 2015-05-29 DIAGNOSIS — R52 Pain, unspecified: Secondary | ICD-10-CM | POA: Diagnosis present

## 2015-05-29 DIAGNOSIS — I10 Essential (primary) hypertension: Secondary | ICD-10-CM | POA: Diagnosis not present

## 2015-05-29 LAB — CBC WITH DIFFERENTIAL/PLATELET
BASOS ABS: 0 10*3/uL (ref 0.0–0.1)
Basophils Relative: 0 % (ref 0–1)
Eosinophils Absolute: 0.2 10*3/uL (ref 0.0–0.7)
Eosinophils Relative: 1 % (ref 0–5)
HCT: 39.9 % (ref 36.0–46.0)
Hemoglobin: 13.1 g/dL (ref 12.0–15.0)
LYMPHS ABS: 3 10*3/uL (ref 0.7–4.0)
Lymphocytes Relative: 20 % (ref 12–46)
MCH: 28.5 pg (ref 26.0–34.0)
MCHC: 32.8 g/dL (ref 30.0–36.0)
MCV: 86.7 fL (ref 78.0–100.0)
Monocytes Absolute: 1.2 10*3/uL — ABNORMAL HIGH (ref 0.1–1.0)
Monocytes Relative: 8 % (ref 3–12)
NEUTROS PCT: 71 % (ref 43–77)
Neutro Abs: 10.8 10*3/uL — ABNORMAL HIGH (ref 1.7–7.7)
PLATELETS: 275 10*3/uL (ref 150–400)
RBC: 4.6 MIL/uL (ref 3.87–5.11)
RDW: 13.3 % (ref 11.5–15.5)
WBC: 15.3 10*3/uL — AB (ref 4.0–10.5)

## 2015-05-29 LAB — COMPREHENSIVE METABOLIC PANEL
ALT: 19 U/L (ref 14–54)
ANION GAP: 9 (ref 5–15)
AST: 19 U/L (ref 15–41)
Albumin: 3.6 g/dL (ref 3.5–5.0)
Alkaline Phosphatase: 80 U/L (ref 38–126)
BILIRUBIN TOTAL: 0.4 mg/dL (ref 0.3–1.2)
BUN: 21 mg/dL — AB (ref 6–20)
CO2: 21 mmol/L — ABNORMAL LOW (ref 22–32)
CREATININE: 1.22 mg/dL — AB (ref 0.44–1.00)
Calcium: 9.2 mg/dL (ref 8.9–10.3)
Chloride: 106 mmol/L (ref 101–111)
GFR calc Af Amer: >60 mL/min (ref >60)
GFR, EST NON AFRICAN AMERICAN: 54 mL/min — AB (ref >60)
GLUCOSE: 91 mg/dL (ref 65–99)
Potassium: 4.2 mmol/L (ref 3.5–5.1)
SODIUM: 136 mmol/L (ref 135–145)
Total Protein: 8.5 g/dL — ABNORMAL HIGH (ref 6.5–8.1)

## 2015-05-29 LAB — I-STAT BETA HCG BLOOD, ED (MC, WL, AP ONLY)

## 2015-05-29 LAB — LIPASE, BLOOD: Lipase: 48 U/L (ref 22–51)

## 2015-05-29 NOTE — ED Provider Notes (Signed)
Medical screening examination/treatment/procedure(s) were conducted as a shared visit with non-physician practitioner(s) and myself.  I personally evaluated the patient during the encounter.   EKG Interpretation None      Pt is a 44 y.o. female with history of hypertension, anxiety who presents to the emergency department with multiple complaints. She complains of intermittent generalized body numbness with no focal neurologic deficit, swelling and pain under her armpits, feeling like her feet are tight and having a metallic taste in her mouth. Her exam is completely benign and she is nontoxic, afebrile, hemodynamically stable. No focal neurologic deficits. Patient has a leukocytosis with left shift which is nonspecific. Otherwise her labs are unremarkable. She is not pregnant. I discussed with patient I do not feel there is any life-threatening illness present have recommended outpatient follow-up. She verbalized understanding and is comfortable with this plan.  [place], DO 05/29/15 [phone]
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
## 2015-05-29 NOTE — Discharge Instructions (Signed)
Nausea, Adult Nausea is the feeling that you have an upset stomach or have to vomit. Nausea by itself is not likely a serious concern, but it may be an early sign of more serious medical problems. As nausea gets worse, it can lead to vomiting. If vomiting develops, there is the risk of dehydration.  CAUSES   Viral infections.  Food poisoning.  Medicines.  Pregnancy.  Motion sickness.  Migraine headaches.  Emotional distress.  Severe pain from any source.  Alcohol intoxication. HOME CARE INSTRUCTIONS  Get plenty of rest.  Ask your caregiver about specific rehydration instructions.  Eat small amounts of food and sip liquids more often.  Take all medicines as told by your caregiver. SEEK MEDICAL CARE IF:  You have not improved after 2 days, or you get worse.  You have a headache. SEEK IMMEDIATE MEDICAL CARE IF:   You have a fever.  You faint.  You keep vomiting or have blood in your vomit.  You are extremely weak or dehydrated.  You have dark or bloody stools.  You have severe chest or abdominal pain. MAKE SURE YOU:  Understand these instructions.  Will watch your condition.  Will get help right away if you are not doing well or get worse. Document Released: 12/17/2004 Document Revised: 08/03/2012 Document Reviewed: 07/22/2011 [facility] Patient Information 2015 [place], [place]. This information is not intended to replace advice given to you by your health care provider. Make sure you discuss any questions you have with your health care provider.   Emergency Department Resource Guide 1) Find a Doctor and Pay Out of Pocket Although you won't have to find out who is covered by your insurance plan, it is a good idea to ask around and get recommendations. You will then need to call the office and see if the doctor you have chosen will accept you as a new patient and what types of options they offer for patients who are self-pay. Some doctors offer discounts or will  set up payment plans for their patients who do not have insurance, but you will need to ask so you aren't surprised when you get to your appointment.  2) Contact Your Local Health Department Not all health departments have doctors that can see patients for sick visits, but many do, so it is worth a call to see if yours does. If you don't know where your local health department is, you can check in your phone book. The CDC also has a tool to help you locate your state's health department, and many state websites also have listings of all of their local health departments.  3) Find a [facility] If your illness is not likely to be very severe or complicated, you may want to try a walk in clinic. These are popping up all over the country in pharmacies, drugstores, and shopping centers. They're usually staffed by nurse practitioners or physician assistants that have been trained to treat common illnesses and complaints. They're usually fairly quick and inexpensive. However, if you have serious medical issues or chronic medical problems, these are probably not your best option.  No Primary Care Doctor: - Call Health Connect at  [phone] - they can help you locate a primary care doctor that  accepts your insurance, provides certain services, etc. - Physician Referral Service- [phone]  Chronic Pain Problems: Organization         Address  Phone   Notes  [facility]  [phone] Patients need to  be referred by their primary care doctor.   Medication Assistance: Organization         Address  Phone   Notes  [facility] Medication [facility] [address] [place], [place] [address] [phone] --Must be a resident of [facility] -- Must have NO insurance coverage whatsoever (no Medicaid/ Medicare, etc.) -- The pt. MUST have a primary care doctor that directs their care regularly and follows them in the community   [facility]  [phone]    [employer]  [phone]    Agencies that provide inexpensive medical care: Organization         Address  Phone   Notes  [place]  [phone]   [facility]    [phone]   [facility] [place], [place] [address] [phone]   [place] [address], [place] [phone]   Planned Parenthood    [phone]   [facility]    [phone]   [place] and [place] [address], [place] Phone:  [phone], Fax:  [phone] Hours of Operation:  9 am - 6 pm, M-F.  Also accepts Medicaid/Medicare and self-pay.  [facility] for [place] [address], [address], [place] Phone: [phone], Fax: [phone]. Hours of Operation:  8:30 am - 5:30 pm, M-F.  Also accepts Medicaid and self-pay.  [facility] [address], [employer] Phone: [phone]   [place], [place], [place] [phone], Ext. [phone] Mondays & Thursdays: 7-9 AM.  First 15 patients are seen on a first come, first serve basis.    [place] Providers:  Organization         Address  Phone   Notes  [facility] [address], [place] [phone] Also accepts self-pay patients.  [facility] [address], [place]  [phone]   [place], [address], [place] [phone]   [facility] [address] [place] [phone]   Lucianne Lei [address], [place]   [phone] Only accepts [place] Access [place] patients after they have their name applied to their card.   Self-Pay (no insurance) in [facility]:  Organization         Address  Phone   Notes  Sickle Cell Patients, [facility] [place] [phone]   [facility] [place] [phone]   [facility] [place]  [place], [place], [place] [phone]   [facility]/Dr. Osei-Bonsu  [address] [place] or [address], [place] [phone] Phone number for both [place] and [place] locations is the same.  [facility] [address], [place] [phone]   [facility] [address] [place] or [address] [phone]   [facility] [address], [place] [phone], phone; [phone], fax Sees patients 1st and 3rd Saturday of every month.  Must not qualify for public or private insurance (i.e. Medicaid, Medicare, [place] Health Choice, Veterans' Benefits)  Household income should be  no more than 200% of the poverty level The clinic cannot treat you if you are pregnant or think you are pregnant  Sexually transmitted diseases are not treated at the clinic.    Dental Care: Organization         Address  Phone  Notes  [facility] Department of [facility] [place] [phone] Accepts children up to age 75 who are enrolled in [place] or [place]; pregnant women with a Medicaid card; and children who have applied for Medicaid or [place] Health Choice, but were declined, whose parents can pay a reduced fee at time of service.  [facility] Department of [facility]  [address], [place] [phone] Accepts children up to age 31 who are enrolled in [place] or [place]; pregnant women with a Medicaid card; and children who have applied for Medicaid or [place] Health Choice, but were declined, whose parents can pay a reduced fee at time of service.  [place] Adult Dental Access PROGRAM  [place] [phone] Patients are seen by appointment only. Walk-ins are not accepted. [place] will see patients 62 years of age and older. Monday - Tuesday (8am-5pm) Most Wednesdays (8:30-5pm) $30 per visit, cash only  [facility] Adult Dental Access PROGRAM  [address], [facility] [phone] Patients are seen by appointment only. Walk-ins are not accepted. [place] will see patients 25 years of age and older. One Wednesday Evening (Monthly: Volunteer Based).  $30 per visit, cash only  [place]  [phone] for adults; Children under age 51, call Graduate Pediatric Dentistry at [phone]. Children aged 88-14, please call [phone] to request a pediatric application.  Dental services are provided in all areas of dental care including fillings, crowns and bridges, complete and partial dentures, implants, gum treatment, root canals, and extractions. Preventive care is also provided. Treatment is provided to both adults and children. Patients are selected via a lottery and there is often a waiting list.   [facility] [address], [place]  [phone] [email or web address]   [facility] [address] [place], [place] [phone], Ext. [phone] Second and Fourth Thursday of each month, opens at 6:30 AM; Clinic ends at 9 AM.  Patients are seen on a first-come first-served basis, and a limited number are seen during each clinic.   [facility]  [address] [place] Danker [place], [place] [phone]   Eligibility Requirements You must have lived in [place], [place], or [place] counties for at least the last three months.   You cannot be eligible for state or federal sponsored [employer], including [employer], [place], or [employer].   You generally cannot be eligible for healthcare insurance through your employer.    How to apply: Eligibility screenings are held every Tuesday and Wednesday afternoon  from 1:00 pm until 4:00 pm. You do not need an appointment for the interview!  [facility] [address], [place], [place]   [place]  [place]  [place]  [phone]

## 2015-05-29 NOTE — ED Notes (Signed)
Pt complains of generalized body numbness, both armpits are swollen and sore, she also states that her feet feel tight and she has a metallic taste in her mouth.

## 2015-10-23 ENCOUNTER — Emergency Department (HOSPITAL_COMMUNITY)
Admission: EM | Admit: 2015-10-23 | Discharge: 2015-10-23 | Disposition: A | Discharge status: Home / Self Care | Payer: Medicaid Other | Attending: Emergency Medicine | Admitting: Emergency Medicine

## 2015-10-23 ENCOUNTER — Encounter (HOSPITAL_COMMUNITY): Payer: Self-pay | Admitting: Family Medicine

## 2015-10-23 DIAGNOSIS — F17210 Nicotine dependence, cigarettes, uncomplicated: Secondary | ICD-10-CM | POA: Diagnosis not present

## 2015-10-23 DIAGNOSIS — Z3202 Encounter for pregnancy test, result negative: Secondary | ICD-10-CM | POA: Diagnosis not present

## 2015-10-23 DIAGNOSIS — F329 Major depressive disorder, single episode, unspecified: Secondary | ICD-10-CM | POA: Diagnosis not present

## 2015-10-23 DIAGNOSIS — Z79899 Other long term (current) drug therapy: Secondary | ICD-10-CM | POA: Insufficient documentation

## 2015-10-23 DIAGNOSIS — I10 Essential (primary) hypertension: Secondary | ICD-10-CM | POA: Diagnosis not present

## 2015-10-23 DIAGNOSIS — F419 Anxiety disorder, unspecified: Secondary | ICD-10-CM | POA: Diagnosis not present

## 2015-10-23 DIAGNOSIS — Z8673 Personal history of transient ischemic attack (TIA), and cerebral infarction without residual deficits: Secondary | ICD-10-CM | POA: Diagnosis not present

## 2015-10-23 DIAGNOSIS — N760 Acute vaginitis: Secondary | ICD-10-CM | POA: Diagnosis not present

## 2015-10-23 DIAGNOSIS — Z113 Encounter for screening for infections with a predominantly sexual mode of transmission: Secondary | ICD-10-CM | POA: Diagnosis present

## 2015-10-23 DIAGNOSIS — B9689 Other specified bacterial agents as the cause of diseases classified elsewhere: Secondary | ICD-10-CM

## 2015-10-23 LAB — WET PREP, GENITAL
SPERM: NONE SEEN
TRICH WET PREP: NONE SEEN
YEAST WET PREP: NONE SEEN

## 2015-10-23 LAB — URINALYSIS, ROUTINE W REFLEX MICROSCOPIC
Bilirubin Urine: NEGATIVE
GLUCOSE, UA: NEGATIVE mg/dL
Hgb urine dipstick: NEGATIVE
KETONES UR: NEGATIVE mg/dL
LEUKOCYTES UA: NEGATIVE
NITRITE: NEGATIVE
PH: 6 (ref 5.0–8.0)
Protein, ur: 30 mg/dL — AB
SPECIFIC GRAVITY, URINE: 1.016 (ref 1.005–1.030)

## 2015-10-23 LAB — POC URINE PREG, ED: Preg Test, Ur: NEGATIVE

## 2015-10-23 LAB — URINE MICROSCOPIC-ADD ON

## 2015-10-23 LAB — HIV ANTIBODY (ROUTINE TESTING W REFLEX): HIV Screen 4th Generation wRfx: NONREACTIVE

## 2015-10-23 LAB — RPR: RPR Ser Ql: NONREACTIVE

## 2015-10-23 MED ORDER — METRONIDAZOLE 500 MG PO TABS: 500.0000 mg | ORAL_TABLET | Freq: Two times a day (BID) | ORAL | Status: DC
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
## 2015-10-23 NOTE — Discharge Instructions (Signed)
Bacterial Vaginosis Follow-up with her primary care physician. Do not drink alcohol when taking Flagyl. Bacterial vaginosis is a vaginal infection that occurs when the normal balance of bacteria in the vagina is disrupted. It results from an overgrowth of certain bacteria. This is the most common vaginal infection in women of childbearing age. Treatment is important to prevent complications, especially in pregnant women, as it can cause a premature delivery. CAUSES  Bacterial vaginosis is caused by an increase in harmful bacteria that are normally present in smaller amounts in the vagina. Several different kinds of bacteria can cause bacterial vaginosis. However, the reason that the condition develops is not fully understood. RISK FACTORS Certain activities or behaviors can put you at an increased risk of developing bacterial vaginosis, including:  Having a new sex partner or multiple sex partners.  Douching.  Using an intrauterine device (IUD) for contraception. Women do not get bacterial vaginosis from toilet seats, bedding, swimming pools, or contact with objects around them. SIGNS AND SYMPTOMS  Some women with bacterial vaginosis have no signs or symptoms. Common symptoms include:  Grey vaginal discharge.  A fishlike odor with discharge, especially after sexual intercourse.  Itching or burning of the vagina and vulva.  Burning or pain with urination. DIAGNOSIS  Your health care provider will take a medical history and examine the vagina for signs of bacterial vaginosis. A sample of vaginal fluid may be taken. Your health care provider will look at this sample under a microscope to check for bacteria and abnormal cells. A vaginal pH test may also be done.  TREATMENT  Bacterial vaginosis may be treated with antibiotic medicines. These may be given in the form of a pill or a vaginal cream. A second round of antibiotics may be prescribed if the condition comes back after treatment. Because  bacterial vaginosis increases your risk for sexually transmitted diseases, getting treated can help reduce your risk for chlamydia, gonorrhea, HIV, and herpes. HOME CARE INSTRUCTIONS   Only take over-the-counter or prescription medicines as directed by your health care provider.  If antibiotic medicine was prescribed, take it as directed. Make sure you finish it even if you start to feel better.  Tell all sexual partners that you have a vaginal infection. They should see their health care provider and be treated if they have problems, such as a mild rash or itching.  During treatment, it is important that you follow these instructions:  Avoid sexual activity or use condoms correctly.  Do not douche.  Avoid alcohol as directed by your health care provider.  Avoid breastfeeding as directed by your health care provider. SEEK MEDICAL CARE IF:   Your symptoms are not improving after 3 days of treatment.  You have increased discharge or pain.  You have a fever. MAKE SURE YOU:   Understand these instructions.  Will watch your condition.  Will get help right away if you are not doing well or get worse. FOR MORE INFORMATION  Centers for Disease Control and Prevention, Division of STD Prevention: [employer] American Sexual Health Association (ASHA): www.ashastd.org    This information is not intended to replace advice given to you by your health care provider. Make sure you discuss any questions you have with your health care provider.   Document Released: 11/09/2005 Document Revised: 11/30/2014 Document Reviewed: 06/21/2013 Elsevier Interactive Patient Education [employer].

## 2015-10-23 NOTE — ED Notes (Signed)
Pt reports she would like to be checked for an STD. One partner and using protection except yesterday. Pt reports about 1-2 months ago, while having intercourse, the condom came off. Lower abd cramps with vaginal discharge. Discharge is watery, white and small amount.

## 2015-10-23 NOTE — ED Notes (Signed)
Pelvic exam set up at bedside.  

## 2015-10-24 LAB — GC/CHLAMYDIA PROBE AMP (~~LOC~~) NOT AT ARMC
Chlamydia: NEGATIVE
NEISSERIA GONORRHEA: NEGATIVE

## 2015-10-29 ENCOUNTER — Telehealth: Payer: Self-pay | Admitting: *Deleted

## 2015-10-29 NOTE — Telephone Encounter (Signed)
Patient contacted the office stating she had questions about her IUD. Attempted to contact the patient and unable to leave message due to no answer

## 2015-10-31 NOTE — Telephone Encounter (Signed)
Patient returned call to the office. Attempted to contact the patient and no answer. Unable to leave a message.

## 2015-12-12 ENCOUNTER — Other Ambulatory Visit: Payer: Self-pay | Admitting: Obstetrics

## 2015-12-12 ENCOUNTER — Ambulatory Visit (INDEPENDENT_AMBULATORY_CARE_PROVIDER_SITE_OTHER): Payer: Medicaid Other | Admitting: Certified Nurse Midwife

## 2015-12-12 VITALS — BP 112/79 | HR 80 | Wt 184.0 lb

## 2015-12-12 DIAGNOSIS — K5901 Slow transit constipation: Secondary | ICD-10-CM | POA: Diagnosis not present

## 2015-12-12 DIAGNOSIS — R399 Unspecified symptoms and signs involving the genitourinary system: Secondary | ICD-10-CM | POA: Diagnosis not present

## 2015-12-12 DIAGNOSIS — N907 Vulvar cyst: Secondary | ICD-10-CM | POA: Diagnosis not present

## 2015-12-12 DIAGNOSIS — N9089 Other specified noninflammatory disorders of vulva and perineum: Secondary | ICD-10-CM

## 2015-12-12 DIAGNOSIS — T8332XS Displacement of intrauterine contraceptive device, sequela: Secondary | ICD-10-CM

## 2015-12-12 LAB — POCT URINALYSIS DIPSTICK
BILIRUBIN UA: NEGATIVE
Blood, UA: NEGATIVE
GLUCOSE UA: NEGATIVE
Ketones, UA: NEGATIVE
LEUKOCYTES UA: NEGATIVE
NITRITE UA: NEGATIVE
Spec Grav, UA: 1.015
Urobilinogen, UA: NEGATIVE
pH, UA: 5

## 2015-12-12 MED ORDER — DOCUSATE SODIUM 100 MG PO CAPS: 100.0000 mg | ORAL_CAPSULE | Freq: Two times a day (BID) | ORAL | Status: DC

## 2015-12-12 MED ORDER — TRIPLE ANTIBIOTIC 5-400-5000 EX OINT: TOPICAL_OINTMENT | Freq: Four times a day (QID) | CUTANEOUS | Status: DC

## 2015-12-13 ENCOUNTER — Encounter: Payer: Self-pay | Admitting: Certified Nurse Midwife
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
## 2015-12-13 NOTE — Progress Notes (Signed)
Patient ID: [name], female   DOB: 10-Mar-1971, 45 y.o.   MRN: [id number] Skin Tag Removal Procedure Note    PROCEDURE: Skin tag removal Performing Provider: Kandis Cocking CNM   Patient education prior to procedure, explained risk, benefits of skin tag removal, reviewed alternative options. Patient reported understanding. Gave consent to continue with procedure.   PROCEDURE:  Pregnancy Text :  not indicated       Sterile Preparation:   Betadinex3    The patient's left labia was palpated and the skin tag located. The area was prepped with Betadinex3. The area around the skin tag was anesthestized with 1.5 cc of 1% lidocaine without epinephrine was injected. A 1.5 mm incision was made and the skin tag was removed in an intact manner. Skin tag sent to pathology.  Hemostasis.     Followup: The patient tolerated the procedure well without complications.  Standard post-procedure care is explained and return precautions are given.  Kandis Cocking CNM

## 2015-12-19 ENCOUNTER — Ambulatory Visit (INDEPENDENT_AMBULATORY_CARE_PROVIDER_SITE_OTHER): Payer: Medicaid Other | Admitting: Certified Nurse Midwife

## 2015-12-19 ENCOUNTER — Ambulatory Visit (INDEPENDENT_AMBULATORY_CARE_PROVIDER_SITE_OTHER): Payer: Medicaid Other

## 2015-12-19 ENCOUNTER — Encounter: Payer: Self-pay | Admitting: Certified Nurse Midwife

## 2015-12-19 VITALS — BP 120/80 | HR 69 | Temp 98.4°F | Wt 185.0 lb

## 2015-12-19 DIAGNOSIS — R1032 Left lower quadrant pain: Secondary | ICD-10-CM

## 2015-12-19 DIAGNOSIS — Z30431 Encounter for routine checking of intrauterine contraceptive device: Secondary | ICD-10-CM

## 2015-12-19 DIAGNOSIS — T8332XS Displacement of intrauterine contraceptive device, sequela: Secondary | ICD-10-CM

## 2015-12-19 DIAGNOSIS — T8339XA Other mechanical complication of intrauterine contraceptive device, initial encounter: Secondary | ICD-10-CM

## 2015-12-21 DIAGNOSIS — T839XXA Unspecified complication of genitourinary prosthetic device, implant and graft, initial encounter: Secondary | ICD-10-CM | POA: Insufficient documentation
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
## 2015-12-25 ENCOUNTER — Other Ambulatory Visit: Payer: Self-pay | Admitting: Certified Nurse Midwife

## 2016-01-15 ENCOUNTER — Encounter: Payer: Self-pay | Admitting: Certified Nurse Midwife

## 2016-01-15 ENCOUNTER — Ambulatory Visit (INDEPENDENT_AMBULATORY_CARE_PROVIDER_SITE_OTHER): Payer: Medicaid Other | Admitting: Certified Nurse Midwife

## 2016-01-15 VITALS — BP 125/89 | HR 73 | Wt 180.0 lb

## 2016-01-15 DIAGNOSIS — Z0000 Encounter for general adult medical examination without abnormal findings: Secondary | ICD-10-CM | POA: Diagnosis not present

## 2016-01-15 DIAGNOSIS — Z01419 Encounter for gynecological examination (general) (routine) without abnormal findings: Secondary | ICD-10-CM
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
## 2016-01-16 ENCOUNTER — Telehealth: Payer: Self-pay

## 2016-01-16 ENCOUNTER — Other Ambulatory Visit: Payer: Self-pay | Admitting: Certified Nurse Midwife

## 2016-01-16 DIAGNOSIS — Z1231 Encounter for screening mammogram for malignant neoplasm of breast: Secondary | ICD-10-CM

## 2016-01-16 NOTE — Telephone Encounter (Signed)
called patient with [facility]gram appt at [facility] - 01/30/16 at 8:20am

## 2016-01-17 LAB — PAP, TP IMAGING W/ HPV RNA, RFLX HPV TYPE 16,18/45: HPV mRNA, High Risk: DETECTED — AB

## 2016-01-20 LAB — SURESWAB, VAGINOSIS/VAGINITIS PLUS
Atopobium vaginae: 6.3 Log (cells/mL)
BV CATEGORY: UNDETERMINED — AB
C. ALBICANS, DNA: NOT DETECTED
C. GLABRATA, DNA: NOT DETECTED
C. PARAPSILOSIS, DNA: NOT DETECTED
C. TRACHOMATIS RNA, TMA: NOT DETECTED
C. tropicalis, DNA: NOT DETECTED
Gardnerella vaginalis: 7.1 Log (cells/mL)
LACTOBACILLUS SPECIES: 5 Log (cells/mL)
MEGASPHAERA SPECIES: 7.5 Log (cells/mL)
N. GONORRHOEAE RNA, TMA: NOT DETECTED
T. vaginalis RNA, QL TMA: NOT DETECTED

## 2016-01-21 ENCOUNTER — Other Ambulatory Visit: Payer: Self-pay | Admitting: Certified Nurse Midwife

## 2016-01-21 DIAGNOSIS — B9689 Other specified bacterial agents as the cause of diseases classified elsewhere: Secondary | ICD-10-CM

## 2016-01-21 DIAGNOSIS — N760 Acute vaginitis: Principal | ICD-10-CM

## 2016-01-21 LAB — HPV TYPE 16 AND 18/45 RNA
HPV TYPE 16 RNA: NOT DETECTED
HPV TYPE 18/45 RNA: NOT DETECTED

## 2016-01-21 MED ORDER — METRONIDAZOLE 500 MG PO TABS: 500.0000 mg | ORAL_TABLET | Freq: Two times a day (BID) | ORAL | Status: DC

## 2016-01-30 ENCOUNTER — Ambulatory Visit
Admission: RE | Admit: 2016-01-30 | Discharge: 2016-01-30 | Disposition: A | Discharge status: Home / Self Care | Payer: Medicaid Other | Source: Ambulatory Visit | Attending: Certified Nurse Midwife | Admitting: Certified Nurse Midwife

## 2016-01-30 DIAGNOSIS — Z1231 Encounter for screening mammogram for malignant neoplasm of breast: Secondary | ICD-10-CM

## 2016-02-14 NOTE — Telephone Encounter (Signed)
Patient seen since phone call 

## 2016-03-10 ENCOUNTER — Telehealth: Payer: Self-pay | Admitting: *Deleted

## 2016-03-10 NOTE — Telephone Encounter (Signed)
Question about BV- request call back 5:22 No answer.

## 2016-03-12 ENCOUNTER — Telehealth: Payer: Self-pay | Admitting: *Deleted

## 2016-03-12 DIAGNOSIS — N760 Acute vaginitis: Principal | ICD-10-CM

## 2016-03-12 DIAGNOSIS — B9689 Other specified bacterial agents as the cause of diseases classified elsewhere: Secondary | ICD-10-CM

## 2016-03-12 MED ORDER — METRONIDAZOLE 500 MG PO TABS
500.0000 mg | ORAL_TABLET | Freq: Two times a day (BID) | ORAL | Status: DC
Start: 2016-03-12 — End: 2016-07-01

## 2016-03-12 NOTE — Telephone Encounter (Signed)
Patient is complaining of urinary symptoms- but in talking to her - she is really having more vaginal symptoms. She complains of discharge and irritation and states her symptoms are more vaginal than urinary. She has had chronic BV on several of her test in the office and as we discussed that- she wants to be treated for that. She will call the office if her symptoms do not improve.

## 2016-03-25 ENCOUNTER — Encounter: Payer: Self-pay | Admitting: Certified Nurse Midwife

## 2016-03-25 ENCOUNTER — Ambulatory Visit (INDEPENDENT_AMBULATORY_CARE_PROVIDER_SITE_OTHER): Payer: Medicaid Other | Admitting: Certified Nurse Midwife

## 2016-03-25 VITALS — BP 143/91 | HR 83 | Wt 185.0 lb

## 2016-03-25 DIAGNOSIS — E669 Obesity, unspecified: Secondary | ICD-10-CM

## 2016-03-25 DIAGNOSIS — L680 Hirsutism: Secondary | ICD-10-CM | POA: Diagnosis not present

## 2016-03-25 DIAGNOSIS — N939 Abnormal uterine and vaginal bleeding, unspecified: Secondary | ICD-10-CM

## 2016-03-25 DIAGNOSIS — T8389XS Other specified complication of genitourinary prosthetic devices, implants and grafts, sequela: Secondary | ICD-10-CM
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
## 2016-03-27 LAB — NUSWAB VAGINITIS PLUS (VG+)
ATOPOBIUM VAGINAE: HIGH {score} — AB
CANDIDA ALBICANS, NAA: NEGATIVE
Candida glabrata, NAA: NEGATIVE
Chlamydia trachomatis, NAA: NEGATIVE
NEISSERIA GONORRHOEAE, NAA: NEGATIVE
Trich vag by NAA: NEGATIVE

## 2016-03-29 LAB — PROLACTIN: PROLACTIN: 8.5 ng/mL (ref 4.8–23.3)

## 2016-03-29 LAB — COMPREHENSIVE METABOLIC PANEL
ALT: 9 IU/L (ref 0–32)
AST: 13 IU/L (ref 0–40)
Albumin/Globulin Ratio: 1 — ABNORMAL LOW (ref 1.2–2.2)
Albumin: 3.9 g/dL (ref 3.5–5.5)
Alkaline Phosphatase: 72 IU/L (ref 39–117)
BUN/Creatinine Ratio: 14 (ref 9–23)
BUN: 16 mg/dL (ref 6–24)
Bilirubin Total: 0.3 mg/dL (ref 0.0–1.2)
CALCIUM: 9.2 mg/dL (ref 8.7–10.2)
CO2: 23 mmol/L (ref 18–29)
CREATININE: 1.14 mg/dL — AB (ref 0.57–1.00)
Chloride: 102 mmol/L (ref 96–106)
GFR calc Af Amer: 68 mL/min/{1.73_m2} (ref >59)
GFR, EST NON AFRICAN AMERICAN: 59 mL/min/{1.73_m2} — AB (ref >59)
Globulin, Total: 3.8 g/dL (ref 1.5–4.5)
Glucose: 83 mg/dL (ref 65–99)
Potassium: 4.3 mmol/L (ref 3.5–5.2)
Sodium: 138 mmol/L (ref 134–144)
TOTAL PROTEIN: 7.7 g/dL (ref 6.0–8.5)

## 2016-03-29 LAB — CBC WITH DIFFERENTIAL/PLATELET
Basophils Absolute: 0 10*3/uL (ref 0.0–0.2)
Basos: 0 %
EOS (ABSOLUTE): 0.2 10*3/uL (ref 0.0–0.4)
EOS: 2 %
HEMATOCRIT: 40.8 % (ref 34.0–46.6)
Hemoglobin: 13.4 g/dL (ref 11.1–15.9)
IMMATURE GRANS (ABS): 0 10*3/uL (ref 0.0–0.1)
IMMATURE GRANULOCYTES: 0 %
Lymphocytes Absolute: 2.5 10*3/uL (ref 0.7–3.1)
Lymphs: 23 %
MCH: 29 pg (ref 26.6–33.0)
MCHC: 32.8 g/dL (ref 31.5–35.7)
MCV: 88 fL (ref 79–97)
MONOS ABS: 0.7 10*3/uL (ref 0.1–0.9)
Monocytes: 7 %
NEUTROS PCT: 68 %
Neutrophils Absolute: 7.1 10*3/uL — ABNORMAL HIGH (ref 1.4–7.0)
PLATELETS: 330 10*3/uL (ref 150–379)
RBC: 4.62 x10E6/uL (ref 3.77–5.28)
RDW: 13.2 % (ref 12.3–15.4)
WBC: 10.5 10*3/uL (ref 3.4–10.8)

## 2016-03-29 LAB — TESTOSTERONE, FREE, TOTAL, SHBG
Sex Hormone Binding: 68.8 nmol/L (ref 24.6–122.0)
TESTOSTERONE: 14 ng/dL (ref 8–48)
Testosterone, Free: 0.6 pg/mL (ref 0.0–4.2)

## 2016-03-29 LAB — TSH: TSH: 0.863 u[IU]/mL (ref 0.450–4.500)

## 2016-03-29 LAB — HEMOGLOBIN A1C
ESTIMATED AVERAGE GLUCOSE: 103 mg/dL
Hgb A1c MFr Bld: 5.2 % (ref 4.8–5.6)

## 2016-03-29 LAB — 17-HYDROXYPROGESTERONE: 17 HYDROXYPROGESTERONE: 16 ng/dL

## 2016-03-29 LAB — PROGESTERONE: Progesterone: <0.1 ng/mL

## 2016-04-09 ENCOUNTER — Ambulatory Visit (INDEPENDENT_AMBULATORY_CARE_PROVIDER_SITE_OTHER): Payer: Medicaid Other

## 2016-04-09 ENCOUNTER — Ambulatory Visit (INDEPENDENT_AMBULATORY_CARE_PROVIDER_SITE_OTHER): Payer: Medicaid Other | Admitting: Certified Nurse Midwife

## 2016-04-09 VITALS — BP 131/96 | HR 80

## 2016-04-09 DIAGNOSIS — D259 Leiomyoma of uterus, unspecified: Secondary | ICD-10-CM

## 2016-04-09 DIAGNOSIS — Z800 Family history of malignant neoplasm of digestive organs: Secondary | ICD-10-CM | POA: Diagnosis not present

## 2016-04-09 DIAGNOSIS — N939 Abnormal uterine and vaginal bleeding, unspecified: Secondary | ICD-10-CM | POA: Diagnosis not present

## 2016-04-09 DIAGNOSIS — L680 Hirsutism: Secondary | ICD-10-CM

## 2016-04-09 DIAGNOSIS — T8389XS Other specified complication of genitourinary prosthetic devices, implants and grafts, sequela: Secondary | ICD-10-CM

## 2016-04-12 ENCOUNTER — Encounter: Payer: Self-pay | Admitting: Certified Nurse Midwife
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
## 2016-07-01 ENCOUNTER — Ambulatory Visit (INDEPENDENT_AMBULATORY_CARE_PROVIDER_SITE_OTHER): Payer: Medicaid Other | Admitting: Certified Nurse Midwife

## 2016-07-01 ENCOUNTER — Encounter: Payer: Self-pay | Admitting: Certified Nurse Midwife

## 2016-07-01 VITALS — BP 134/95 | HR 76 | Wt 185.0 lb

## 2016-07-01 DIAGNOSIS — B373 Candidiasis of vulva and vagina: Secondary | ICD-10-CM

## 2016-07-01 DIAGNOSIS — B3731 Acute candidiasis of vulva and vagina: Secondary | ICD-10-CM

## 2016-07-01 MED ORDER — FLUCONAZOLE 200 MG PO TABS: 200.0000 mg | ORAL_TABLET | Freq: Once | ORAL | 0 refills | Status: AC

## 2016-07-01 MED ORDER — TERCONAZOLE 0.8 % VA CREA: 1.0000 | TOPICAL_CREAM | Freq: Every day | VAGINAL | 0 refills | Status: DC

## 2016-07-01 NOTE — Addendum Note (Signed)
Addended by: Lewie Loron D on: 07/01/2016 05:23 PM   Modules accepted: Orders
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
## 2016-07-06 LAB — NUSWAB VG+, CANDIDA 6SP
Atopobium vaginae: HIGH Score — AB
BVAB 2: HIGH Score — AB
CANDIDA KRUSEI, NAA: NEGATIVE
CANDIDA LUSITANIAE, NAA: NEGATIVE
CANDIDA PARAPSILOSIS, NAA: NEGATIVE
CHLAMYDIA TRACHOMATIS, NAA: NEGATIVE
Candida albicans, NAA: NEGATIVE
Candida glabrata, NAA: NEGATIVE
Candida tropicalis, NAA: NEGATIVE
Neisseria gonorrhoeae, NAA: NEGATIVE
TRICH VAG BY NAA: POSITIVE — AB

## 2016-07-07 ENCOUNTER — Other Ambulatory Visit: Payer: Self-pay | Admitting: Certified Nurse Midwife

## 2016-07-07 DIAGNOSIS — N760 Acute vaginitis: Principal | ICD-10-CM

## 2016-07-07 DIAGNOSIS — B9689 Other specified bacterial agents as the cause of diseases classified elsewhere: Secondary | ICD-10-CM

## 2016-07-07 DIAGNOSIS — A599 Trichomoniasis, unspecified: Secondary | ICD-10-CM

## 2016-07-07 MED ORDER — TINIDAZOLE 500 MG PO TABS: 2.0000 g | ORAL_TABLET | Freq: Every day | ORAL | 0 refills | Status: AC

## 2016-07-09 ENCOUNTER — Telehealth: Payer: Self-pay | Admitting: *Deleted

## 2016-07-09 NOTE — Telephone Encounter (Signed)
Patient made aware of lab results and medication at pharmacy.Patient made aware no alcohol while taking this medication and no sex until she and partner have completed medication.TOC in 4-6 weeks.

## 2016-09-07 ENCOUNTER — Other Ambulatory Visit: Payer: Medicaid Other

## 2016-09-07 VITALS — BP 120/80 | HR 80 | Temp 98.1°F

## 2016-09-07 DIAGNOSIS — A599 Trichomoniasis, unspecified: Secondary | ICD-10-CM

## 2016-09-07 NOTE — Progress Notes (Unsigned)
Patient in office for TOC . Patient advised to leave urine sample and told sample would be sent out for testing. Patient told would be notified ao results and pt. Verbalized understanding.

## 2016-09-10 LAB — TRICHOMONAS VAGINALIS, PROBE AMP: Trich vag by NAA: NEGATIVE

## 2016-09-22 ENCOUNTER — Ambulatory Visit (INDEPENDENT_AMBULATORY_CARE_PROVIDER_SITE_OTHER): Payer: Medicaid Other | Admitting: Obstetrics and Gynecology

## 2016-09-22 DIAGNOSIS — Z7189 Other specified counseling: Secondary | ICD-10-CM | POA: Insufficient documentation

## 2016-09-22 DIAGNOSIS — N644 Mastodynia: Secondary | ICD-10-CM | POA: Diagnosis not present

## 2016-09-22 NOTE — Progress Notes (Signed)
Pt seen today for possible breast lump.  Pt reports 1 week she felt a "golf ball" size lump in her right breast. She also has some burning sensation and pain. She however does not feel the lump any more. She does drink several Pepsi drinks a day. She denies any specific trauma or injury to her breast. No nipple d/c or wt loss. She had a negative screening mammogram this past March. No FH of breast cancer either.   PE AF VSS WDWN female Breast examined in sitting and supine position Sym in appearance, no nipple d/c, no palpable lesions or masses noted, some ropy breast tissue noted bilaterally, no axillary or supra clavicular adenopathy noted  A/P Normal Breast exam  Pt reassured. Encouraged to decrease caffeine intake. Continue with SBE and if she notes any concerns to contact the office for a Dx mammogram.

## 2016-09-22 NOTE — Patient Instructions (Signed)
Breast Self-Awareness Practicing breast self-awareness may pick up problems early, prevent significant medical complications, and possibly save your life. By practicing breast self-awareness, you can become familiar with how your breasts look and feel and if your breasts are changing. This allows you to notice changes early. It can also offer you some reassurance that your breast health is good. One way to learn what is normal for your breasts and whether your breasts are changing is to do a breast self-exam. If you find a lump or something that was not present in the past, it is best to contact your caregiver right away. Other findings that should be evaluated by your caregiver include nipple discharge, especially if it is bloody; skin changes or reddening; areas where the skin seems to be pulled in (retracted); or new lumps and bumps. Breast pain is seldom associated with cancer (malignancy), but should also be evaluated by a caregiver. HOW TO PERFORM A BREAST SELF-EXAM The best time to examine your breasts is 5-7 days after your menstrual period is over. During menstruation, the breasts are lumpier, and it may be more difficult to pick up changes. If you do not menstruate, have reached menopause, or had your uterus removed (hysterectomy), you should examine your breasts at regular intervals, such as monthly. If you are breastfeeding, examine your breasts after a feeding or after using a breast pump. Breast implants do not decrease the risk for lumps or tumors, so continue to perform breast self-exams as recommended. Talk to your caregiver about how to determine the difference between the implant and breast tissue. Also, talk about the amount of pressure you should use during the exam. Over time, you will become more familiar with the variations of your breasts and more comfortable with the exam. A breast self-exam requires you to remove all your clothes above the waist. 1. Look at your breasts and nipples.  Stand in front of a mirror in a room with good lighting. With your hands on your hips, push your hands firmly downward. Look for a difference in shape, contour, and size from one breast to the other (asymmetry). Asymmetry includes puckers, dips, or bumps. Also, look for skin changes, such as reddened or scaly areas on the breasts. Look for nipple changes, such as discharge, dimpling, repositioning, or redness. 2. Carefully feel your breasts. This is best done either in the shower or tub while using soapy water or when flat on your back. Place the arm (on the side of the breast you are examining) above your head. Use the pads (not the fingertips) of your three middle fingers on your opposite hand to feel your breasts. Start in the underarm area and use  inch (2 cm) overlapping circles to feel your breast. Use 3 different levels of pressure (light, medium, and firm pressure) at each circle before moving to the next circle. The light pressure is needed to feel the tissue closest to the skin. The medium pressure will help to feel breast tissue a little deeper, while the firm pressure is needed to feel the tissue close to the ribs. Continue the overlapping circles, moving downward over the breast until you feel your ribs below your breast. Then, move one finger-width towards the center of the body. Continue to use the  inch (2 cm) overlapping circles to feel your breast as you move slowly up toward the collar bone (clavicle) near the base of the neck. Continue the up and down exam using all 3 pressures until you reach the   middle of the chest. Do this with each breast, carefully feeling for lumps or changes. 3.  Keep a written record with breast changes or normal findings for each breast. By writing this information down, you do not need to depend only on memory for size, tenderness, or location. Write down where you are in your menstrual cycle, if you are still menstruating. Breast tissue can have some lumps or  thick tissue. However, see your caregiver if you find anything that concerns you.  SEEK MEDICAL CARE IF:  You see a change in shape, contour, or size of your breasts or nipples.   You see skin changes, such as reddened or scaly areas on the breasts or nipples.   You have an unusual discharge from your nipples.   You feel a new lump or unusually thick areas.    This information is not intended to replace advice given to you by your health care provider. Make sure you discuss any questions you have with your health care provider.   Document Released: 11/09/2005 Document Revised: 10/26/2012 Document Reviewed: 02/24/2012 Elsevier Interactive Patient Education 2016 Elsevier Inc.  

## 2017-01-15 ENCOUNTER — Ambulatory Visit: Payer: Self-pay | Admitting: Certified Nurse Midwife

## 2017-01-18 ENCOUNTER — Encounter: Payer: Self-pay | Admitting: Obstetrics and Gynecology

## 2017-01-18 ENCOUNTER — Ambulatory Visit (INDEPENDENT_AMBULATORY_CARE_PROVIDER_SITE_OTHER): Payer: Medicaid Other | Admitting: Obstetrics and Gynecology

## 2017-01-18 ENCOUNTER — Other Ambulatory Visit (HOSPITAL_COMMUNITY)
Admission: RE | Admit: 2017-01-18 | Discharge: 2017-01-18 | Disposition: A | Discharge status: Home / Self Care | Payer: Medicaid Other | Source: Ambulatory Visit | Attending: Obstetrics and Gynecology | Admitting: Obstetrics and Gynecology

## 2017-01-18 VITALS — BP 152/98 | HR 93 | Temp 98.2°F | Wt 189.7 lb

## 2017-01-18 DIAGNOSIS — Z113 Encounter for screening for infections with a predominantly sexual mode of transmission: Secondary | ICD-10-CM | POA: Diagnosis present

## 2017-01-18 DIAGNOSIS — Z01419 Encounter for gynecological examination (general) (routine) without abnormal findings: Secondary | ICD-10-CM | POA: Insufficient documentation

## 2017-01-18 DIAGNOSIS — Z0000 Encounter for general adult medical examination without abnormal findings: Secondary | ICD-10-CM | POA: Diagnosis not present

## 2017-01-18 DIAGNOSIS — Z1151 Encounter for screening for human papillomavirus (HPV): Secondary | ICD-10-CM | POA: Diagnosis present

## 2017-01-18 DIAGNOSIS — Z7189 Other specified counseling: Secondary | ICD-10-CM

## 2017-01-18 DIAGNOSIS — Z30431 Encounter for routine checking of intrauterine contraceptive device: Secondary | ICD-10-CM
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
## 2017-01-20 LAB — CYTOLOGY - PAP
ADEQUACY: ABSENT
CANDIDA VAGINITIS: NEGATIVE
CHLAMYDIA, DNA PROBE: NEGATIVE
Diagnosis: NEGATIVE
HPV (WINDOPATH): DETECTED — AB
Neisseria Gonorrhea: NEGATIVE
TRICH (WINDOWPATH): NEGATIVE

## 2017-01-27 ENCOUNTER — Telehealth: Payer: Self-pay

## 2017-01-27 NOTE — Telephone Encounter (Signed)
Spoke to patient and advised of results

## 2017-03-07 ENCOUNTER — Encounter (HOSPITAL_COMMUNITY): Payer: Self-pay | Admitting: Emergency Medicine

## 2017-03-07 ENCOUNTER — Emergency Department (HOSPITAL_COMMUNITY)
Admission: EM | Admit: 2017-03-07 | Discharge: 2017-03-07 | Disposition: A | Discharge status: Home / Self Care | Payer: Medicaid Other | Attending: Emergency Medicine | Admitting: Emergency Medicine

## 2017-03-07 DIAGNOSIS — N760 Acute vaginitis: Secondary | ICD-10-CM | POA: Insufficient documentation

## 2017-03-07 DIAGNOSIS — I10 Essential (primary) hypertension: Secondary | ICD-10-CM | POA: Insufficient documentation

## 2017-03-07 DIAGNOSIS — Z79899 Other long term (current) drug therapy: Secondary | ICD-10-CM | POA: Diagnosis not present

## 2017-03-07 DIAGNOSIS — R102 Pelvic and perineal pain: Secondary | ICD-10-CM | POA: Diagnosis present

## 2017-03-07 DIAGNOSIS — B9689 Other specified bacterial agents as the cause of diseases classified elsewhere: Secondary | ICD-10-CM | POA: Diagnosis not present

## 2017-03-07 DIAGNOSIS — Z87891 Personal history of nicotine dependence: Secondary | ICD-10-CM | POA: Insufficient documentation

## 2017-03-07 DIAGNOSIS — Z8673 Personal history of transient ischemic attack (TIA), and cerebral infarction without residual deficits: Secondary | ICD-10-CM | POA: Diagnosis not present

## 2017-03-07 HISTORY — DX: Post-traumatic stress disorder, unspecified: F43.10

## 2017-03-07 LAB — URINALYSIS, ROUTINE W REFLEX MICROSCOPIC
Bilirubin Urine: NEGATIVE
GLUCOSE, UA: NEGATIVE mg/dL
Hgb urine dipstick: NEGATIVE
Ketones, ur: NEGATIVE mg/dL
LEUKOCYTES UA: NEGATIVE
NITRITE: NEGATIVE
PH: 5 (ref 5.0–8.0)
Protein, ur: NEGATIVE mg/dL
SPECIFIC GRAVITY, URINE: 1.017 (ref 1.005–1.030)

## 2017-03-07 LAB — WET PREP, GENITAL
Sperm: NONE SEEN
TRICH WET PREP: NONE SEEN
YEAST WET PREP: NONE SEEN

## 2017-03-07 LAB — POC URINE PREG, ED: Preg Test, Ur: NEGATIVE

## 2017-03-07 LAB — I-STAT BETA HCG BLOOD, ED (MC, WL, AP ONLY)

## 2017-03-07 LAB — HIV ANTIBODY (ROUTINE TESTING W REFLEX): HIV Screen 4th Generation wRfx: NONREACTIVE

## 2017-03-07 LAB — RPR: RPR: NONREACTIVE

## 2017-03-07 MED ORDER — METRONIDAZOLE 0.75 % VA GEL: 1.0000 | Freq: Two times a day (BID) | VAGINAL | 0 refills | Status: DC

## 2017-03-07 NOTE — ED Notes (Signed)
Called to take pt to room  No response from lobby, checked lobby restroom and outside  Do not see pt

## 2017-03-07 NOTE — ED Triage Notes (Signed)
Pt states her normal period starts at the end of the month and carries over into the beginning of the next month  Pt states this last time she only had spotting  No abnormal discharge or odor
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
## 2017-03-08 LAB — GC/CHLAMYDIA PROBE AMP (~~LOC~~) NOT AT ARMC
Chlamydia: NEGATIVE
NEISSERIA GONORRHEA: NEGATIVE

## 2017-09-16 ENCOUNTER — Telehealth: Payer: Self-pay

## 2017-09-16 NOTE — Telephone Encounter (Signed)
Pt states she has hx of CT and is requesting to be checked again. She also c/o yeast infection; sx's are improving with Monistat. Pt scheduled tomorrow for self swab.

## 2017-09-17 ENCOUNTER — Ambulatory Visit: Payer: Medicaid Other

## 2017-09-17 ENCOUNTER — Other Ambulatory Visit (HOSPITAL_COMMUNITY)
Admission: RE | Admit: 2017-09-17 | Discharge: 2017-09-17 | Disposition: A | Discharge status: Home / Self Care | Payer: Medicaid Other | Source: Ambulatory Visit | Attending: Obstetrics | Admitting: Obstetrics

## 2017-09-17 VITALS — BP 134/91 | HR 77 | Temp 97.5°F | Ht 59.5 in | Wt 183.0 lb

## 2017-09-17 DIAGNOSIS — N898 Other specified noninflammatory disorders of vagina: Secondary | ICD-10-CM

## 2017-09-17 NOTE — Progress Notes (Addendum)
SUBJECTIVE:  46 y.o. female complains of creamy vaginal discharge for 7 day(s).  Denies abnormal vaginal bleeding or significant pelvic pain or fever. No UTI symptoms. Denies history of known exposure to STD.  No LMP recorded (lmp unknown). Patient is not currently having periods (Reason: IUD).  OBJECTIVE:  She appears well, afebrile. Urine dipstick: not done.  ASSESSMENT:  Vaginal Discharge, itching  Denies Vaginal Odor, no burning, no pain   PLAN:  GC, chlamydia, trichomonas, BVAG, CVAG probe sent to lab.  Treatment: To be determined once lab results are received  ROV prn if symptoms persist or worsen.   Agree with nursing staff's documentation of this patient's clinic encounter.  Morene Crocker, CNM

## 2017-09-20 LAB — CERVICOVAGINAL ANCILLARY ONLY
Bacterial vaginitis: NEGATIVE
Candida vaginitis: NEGATIVE
Chlamydia: NEGATIVE
Neisseria Gonorrhea: NEGATIVE
Trichomonas: NEGATIVE

## 2017-10-05 ENCOUNTER — Telehealth: Payer: Self-pay

## 2017-10-05 NOTE — Telephone Encounter (Signed)
Returned call and advised of results 

## 2017-10-26 ENCOUNTER — Ambulatory Visit (INDEPENDENT_AMBULATORY_CARE_PROVIDER_SITE_OTHER): Payer: Medicaid Other

## 2017-10-26 ENCOUNTER — Other Ambulatory Visit (HOSPITAL_COMMUNITY)
Admission: RE | Admit: 2017-10-26 | Discharge: 2017-10-26 | Disposition: A | Discharge status: Home / Self Care | Payer: Medicaid Other | Source: Ambulatory Visit | Attending: Obstetrics | Admitting: Obstetrics

## 2017-10-26 VITALS — Wt 186.0 lb

## 2017-10-26 DIAGNOSIS — B9689 Other specified bacterial agents as the cause of diseases classified elsewhere: Secondary | ICD-10-CM | POA: Insufficient documentation

## 2017-10-26 DIAGNOSIS — N760 Acute vaginitis: Secondary | ICD-10-CM | POA: Insufficient documentation

## 2017-10-26 DIAGNOSIS — N898 Other specified noninflammatory disorders of vagina: Secondary | ICD-10-CM | POA: Insufficient documentation

## 2017-10-26 DIAGNOSIS — R300 Dysuria: Secondary | ICD-10-CM | POA: Diagnosis not present

## 2017-10-26 LAB — POCT URINALYSIS DIPSTICK
Bilirubin, UA: NEGATIVE
Blood, UA: NEGATIVE
Glucose, UA: NEGATIVE
Ketones, UA: NEGATIVE
Nitrite, UA: NEGATIVE
PROTEIN UA: NEGATIVE
SPEC GRAV UA: 1.015 (ref 1.010–1.025)
UROBILINOGEN UA: 0.2 U/dL
pH, UA: 7 (ref 5.0–8.0)

## 2017-10-26 NOTE — Progress Notes (Signed)
Nurse visit for vaginal itching and irritation. Pt also c/o burning with urination. Self swab sent and Urine cx.

## 2017-10-27 LAB — CERVICOVAGINAL ANCILLARY ONLY
BACTERIAL VAGINITIS: POSITIVE — AB
CANDIDA VAGINITIS: NEGATIVE
CHLAMYDIA, DNA PROBE: NEGATIVE
Neisseria Gonorrhea: NEGATIVE
Trichomonas: POSITIVE — AB

## 2017-10-28 ENCOUNTER — Other Ambulatory Visit: Payer: Self-pay

## 2017-10-28 DIAGNOSIS — B9689 Other specified bacterial agents as the cause of diseases classified elsewhere: Secondary | ICD-10-CM

## 2017-10-28 DIAGNOSIS — A599 Trichomoniasis, unspecified: Secondary | ICD-10-CM

## 2017-10-28 DIAGNOSIS — N760 Acute vaginitis: Principal | ICD-10-CM

## 2017-10-28 LAB — URINE CULTURE

## 2017-10-28 MED ORDER — METRONIDAZOLE 500 MG PO TABS: ORAL_TABLET | ORAL | 0 refills | Status: DC

## 2017-10-28 MED ORDER — SECNIDAZOLE 2 G PO PACK: 1.0000 | PACK | Freq: Once | ORAL | 0 refills | Status: AC

## 2018-01-04 ENCOUNTER — Ambulatory Visit: Payer: Medicaid Other | Admitting: Certified Nurse Midwife

## 2018-01-04 ENCOUNTER — Other Ambulatory Visit: Payer: Self-pay

## 2018-01-04 ENCOUNTER — Encounter: Payer: Self-pay | Admitting: Certified Nurse Midwife

## 2018-01-04 VITALS — BP 127/82 | HR 68 | Wt 183.5 lb

## 2018-01-04 DIAGNOSIS — Z30432 Encounter for removal of intrauterine contraceptive device: Secondary | ICD-10-CM

## 2018-01-04 DIAGNOSIS — Z30015 Encounter for initial prescription of vaginal ring hormonal contraceptive: Secondary | ICD-10-CM

## 2018-01-04 MED ORDER — ETONOGESTREL-ETHINYL ESTRADIOL 0.12-0.015 MG/24HR VA RING: VAGINAL_RING | VAGINAL | 12 refills | Status: DC

## 2018-01-04 NOTE — Progress Notes (Signed)
Presents for IUD removal. Wants NuvaRing.

## 2018-01-04 NOTE — Progress Notes (Signed)
    GYNECOLOGY OFFICE PROCEDURE NOTE  [name] is a 47 y.o. [phone] here for Mirena  IUD removal, strings not seen on annual exam. No GYN concerns.  Last pap smear was on 01/18/17 and was normal but was + HPV.  IUD Removal  Patient identified, informed consent performed, consent signed.  Patient was in the dorsal lithotomy position, normal external genitalia was noted.  A speculum was placed in the patient's vagina, normal discharge was noted, no lesions. The cervix was visualized, no lesions, no abnormal discharge.  The strings of the IUD were not visualized, so [place] was introduced into the endometrial cavity and the IUD was grasped and removed in its entirety.  Patient tolerated the procedure well.    Patient will use Nuva Ring for contraception.  Routine preventative health maintenance measures emphasized.   Kandis Cocking, [place] for [employer], [place]

## 2018-01-05 DIAGNOSIS — Z30015 Encounter for initial prescription of vaginal ring hormonal contraceptive: Secondary | ICD-10-CM | POA: Insufficient documentation

## 2018-02-23 ENCOUNTER — Other Ambulatory Visit (HOSPITAL_COMMUNITY)
Admission: RE | Admit: 2018-02-23 | Discharge: 2018-02-23 | Disposition: A | Discharge status: Home / Self Care | Payer: Medicaid Other | Source: Ambulatory Visit | Attending: Obstetrics | Admitting: Obstetrics

## 2018-02-23 ENCOUNTER — Ambulatory Visit (INDEPENDENT_AMBULATORY_CARE_PROVIDER_SITE_OTHER): Payer: Medicaid Other | Admitting: *Deleted

## 2018-02-23 VITALS — BP 139/94 | HR 97 | Wt 186.0 lb

## 2018-02-23 DIAGNOSIS — A5901 Trichomonal vulvovaginitis: Secondary | ICD-10-CM | POA: Insufficient documentation

## 2018-02-23 DIAGNOSIS — N898 Other specified noninflammatory disorders of vagina: Secondary | ICD-10-CM | POA: Diagnosis not present

## 2018-02-23 NOTE — Progress Notes (Signed)
SUBJECTIVE:  47 y.o. female complains of clear vaginal discharge. Denies abnormal vaginal bleeding or significant pelvic pain or fever. No UTI symptoms.  Pt has hx of STD exposure and would like screening today.   BP (!) 139/94   Pulse 97   Wt 186 lb (84.4 kg)   BMI 36.94 kg/m   No LMP recorded. (Menstrual status: IUD).  OBJECTIVE:  She appears well, afebrile.  ASSESSMENT:  Vaginal Discharge  Vaginal Odor   PLAN:  GC, chlamydia, trichomonas, BVAG, CVAG probe sent to lab. Treatment: To be determined once lab results are received ROV prn if symptoms persist or worsen.

## 2018-02-24 LAB — CERVICOVAGINAL ANCILLARY ONLY
BACTERIAL VAGINITIS: NEGATIVE
CANDIDA VAGINITIS: NEGATIVE
CHLAMYDIA, DNA PROBE: NEGATIVE
NEISSERIA GONORRHEA: NEGATIVE
TRICH (WINDOWPATH): POSITIVE — AB

## 2018-02-28 ENCOUNTER — Other Ambulatory Visit: Payer: Self-pay

## 2018-02-28 MED ORDER — METRONIDAZOLE 500 MG PO TABS: 500.0000 mg | ORAL_TABLET | ORAL | 0 refills | Status: AC

## 2018-03-15 ENCOUNTER — Other Ambulatory Visit: Payer: Self-pay | Admitting: *Deleted

## 2018-03-15 DIAGNOSIS — A599 Trichomoniasis, unspecified: Secondary | ICD-10-CM

## 2018-03-15 MED ORDER — METRONIDAZOLE 500 MG PO TABS: 2000.0000 mg | ORAL_TABLET | Freq: Once | ORAL | 0 refills | Status: AC

## 2018-03-15 NOTE — Progress Notes (Signed)
Spoke with pt regarding Rx for Trich. Pt was only ginen 1 gram dose. Discussed with Dr Elly Modena, approved to send in Flagyl 2 gram dose. Rx sent, pt aware.

## 2018-03-29 ENCOUNTER — Telehealth: Payer: Self-pay

## 2018-03-29 NOTE — Telephone Encounter (Signed)
Returned call and advised pt when she should get TOC, pt agreed and stated that she would call back to make appt.

## 2018-04-25 ENCOUNTER — Encounter: Payer: Self-pay | Admitting: Certified Nurse Midwife

## 2018-04-25 ENCOUNTER — Ambulatory Visit: Payer: Medicaid Other | Admitting: Certified Nurse Midwife

## 2018-04-25 ENCOUNTER — Other Ambulatory Visit (HOSPITAL_COMMUNITY)
Admission: RE | Admit: 2018-04-25 | Discharge: 2018-04-25 | Disposition: A | Discharge status: Home / Self Care | Payer: Medicaid Other | Source: Ambulatory Visit | Attending: Certified Nurse Midwife | Admitting: Certified Nurse Midwife

## 2018-04-25 VITALS — BP 145/96 | HR 82 | Ht <= 58 in | Wt 188.2 lb

## 2018-04-25 DIAGNOSIS — A599 Trichomoniasis, unspecified: Secondary | ICD-10-CM | POA: Diagnosis present

## 2018-04-25 NOTE — Progress Notes (Signed)
Pt is here for TOC trich.

## 2018-04-26 ENCOUNTER — Encounter: Payer: Self-pay | Admitting: Certified Nurse Midwife

## 2018-04-26 LAB — CERVICOVAGINAL ANCILLARY ONLY: TRICH (WINDOWPATH): NEGATIVE
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
## 2019-11-07 ENCOUNTER — Encounter: Payer: Self-pay | Admitting: Certified Nurse Midwife

## 2019-11-07 ENCOUNTER — Other Ambulatory Visit: Payer: Self-pay

## 2019-11-07 ENCOUNTER — Other Ambulatory Visit (HOSPITAL_COMMUNITY)
Admission: RE | Admit: 2019-11-07 | Discharge: 2019-11-07 | Disposition: A | Discharge status: Home / Self Care | Payer: Medicaid Other | Source: Ambulatory Visit | Attending: Certified Nurse Midwife | Admitting: Certified Nurse Midwife

## 2019-11-07 ENCOUNTER — Ambulatory Visit: Payer: Medicaid Other | Admitting: Certified Nurse Midwife

## 2019-11-07 VITALS — BP 136/90 | HR 81 | Ht 59.5 in | Wt 176.8 lb

## 2019-11-07 DIAGNOSIS — Z113 Encounter for screening for infections with a predominantly sexual mode of transmission: Secondary | ICD-10-CM

## 2019-11-07 DIAGNOSIS — Z01419 Encounter for gynecological examination (general) (routine) without abnormal findings: Secondary | ICD-10-CM | POA: Diagnosis not present

## 2019-11-07 DIAGNOSIS — Z800 Family history of malignant neoplasm of digestive organs: Secondary | ICD-10-CM

## 2019-11-07 DIAGNOSIS — B373 Candidiasis of vulva and vagina: Secondary | ICD-10-CM

## 2019-11-07 DIAGNOSIS — Z3049 Encounter for surveillance of other contraceptives: Secondary | ICD-10-CM

## 2019-11-07 DIAGNOSIS — Z0000 Encounter for general adult medical examination without abnormal findings: Secondary | ICD-10-CM

## 2019-11-07 DIAGNOSIS — B3731 Acute candidiasis of vulva and vagina: Secondary | ICD-10-CM

## 2019-11-07 MED ORDER — ETONOGESTREL-ETHINYL ESTRADIOL 0.12-0.015 MG/24HR VA RING: VAGINAL_RING | VAGINAL | 3 refills | Status: DC

## 2019-11-07 NOTE — Progress Notes (Signed)
Pt presents for annual, pap, and all STD screenings. Pt needs rf on Nuva Ring.  MGM due  Needs colonoscopy d/t family hx per pt

## 2019-11-07 NOTE — Patient Instructions (Signed)
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
## 2019-11-08 LAB — CERVICOVAGINAL ANCILLARY ONLY
Bacterial Vaginitis (gardnerella): NEGATIVE
Candida Glabrata: NEGATIVE
Candida Vaginitis: POSITIVE — AB
Chlamydia: NEGATIVE
Comment: NEGATIVE
Comment: NEGATIVE
Comment: NEGATIVE
Comment: NEGATIVE
Comment: NEGATIVE
Comment: NORMAL
Neisseria Gonorrhea: NEGATIVE
Trichomonas: NEGATIVE

## 2019-11-08 LAB — HEPATITIS C ANTIBODY: Hep C Virus Ab: <0.1 s/co ratio (ref 0.0–0.9)

## 2019-11-08 LAB — HIV ANTIBODY (ROUTINE TESTING W REFLEX): HIV Screen 4th Generation wRfx: NONREACTIVE

## 2019-11-08 LAB — HEPATITIS B SURFACE ANTIGEN: Hepatitis B Surface Ag: NEGATIVE

## 2019-11-08 LAB — RPR: RPR Ser Ql: NONREACTIVE

## 2019-11-12 MED ORDER — FLUCONAZOLE 150 MG PO TABS: 150.0000 mg | ORAL_TABLET | Freq: Every day | ORAL | 1 refills | Status: DC

## 2019-11-12 NOTE — Addendum Note (Signed)
Addended by: Lajean Manes on: 11/12/2019 02:10 AM   Modules accepted: Orders

## 2019-11-14 LAB — CYTOLOGY - PAP
Adequacy: ABSENT
Comment: NEGATIVE
Comment: NEGATIVE
Diagnosis: NEGATIVE
HPV 16: NEGATIVE
HPV 18 / 45: NEGATIVE
High risk HPV: POSITIVE — AB

## 2019-11-29 ENCOUNTER — Encounter: Payer: Self-pay | Admitting: Obstetrics and Gynecology

## 2019-11-29 ENCOUNTER — Other Ambulatory Visit: Payer: Self-pay

## 2019-11-29 ENCOUNTER — Other Ambulatory Visit (HOSPITAL_COMMUNITY)
Admission: RE | Admit: 2019-11-29 | Discharge: 2019-11-29 | Disposition: A | Discharge status: Home / Self Care | Payer: Medicaid Other | Source: Ambulatory Visit | Attending: Obstetrics and Gynecology | Admitting: Obstetrics and Gynecology

## 2019-11-29 ENCOUNTER — Ambulatory Visit: Payer: Medicaid Other | Admitting: Obstetrics and Gynecology

## 2019-11-29 DIAGNOSIS — Z3202 Encounter for pregnancy test, result negative: Secondary | ICD-10-CM

## 2019-11-29 DIAGNOSIS — B977 Papillomavirus as the cause of diseases classified elsewhere: Secondary | ICD-10-CM

## 2019-11-29 DIAGNOSIS — R87810 Cervical high risk human papillomavirus (HPV) DNA test positive: Secondary | ICD-10-CM | POA: Diagnosis present

## 2019-11-29 LAB — POCT URINE PREGNANCY: Preg Test, Ur: NEGATIVE

## 2019-11-29 NOTE — Progress Notes (Signed)
Patient ID: [name], female   DOB: November 14, 1971, 49 y.o.   MRN: [id number]    GYNECOLOGY CLINIC COLPOSCOPY PROCEDURE NOTE  49 y.o. [id number] here for colposcopy for HPV x 2 on 12/18 and 12/20 pap smear with normal cytology  Discussed role for HPV in cervical dysplasia, need for surveillance.  Patient given informed consent, signed copy in the chart, time out was performed.  Placed in lithotomy position. Cervix viewed with speculum and colposcope after application of acetic acid.   Colposcopy adequate? Yes [place] noted ay 9 o'clock. Random Bx at 12 & 6 o'clock. ECC specimen obtained. Monsel's applied All specimens were labelled and sent to pathology.   Patient was given post procedure instructions.  Will follow up pathology and manage accordingly.  Routine preventative health maintenance measures emphasized.    Arlina Robes, MD, [place] Attending [place] for [place]

## 2019-11-29 NOTE — Patient Instructions (Signed)
Colposcopy, Care After This sheet gives you information about how to care for yourself after your procedure. Your doctor may also give you more specific instructions. If you have problems or questions, contact your doctor. What can I expect after the procedure? If you did not have a tissue sample removed (did not have a biopsy), you may only have some spotting for a few days. You can go back to your normal activities. If you had a tissue sample removed, it is common to have:  Soreness and pain. This may last for a few days.  Light-headedness.  Mild bleeding from your vagina or dark-colored, grainy discharge from your vagina. This may last for a few days. You may need to wear a sanitary pad.  Spotting for at least 48 hours after the procedure. Follow these instructions at home:   Take over-the-counter and prescription medicines only as told by your doctor. Ask your doctor what medicines you can start taking again. This is very important if you take blood-thinning medicine.  Do not drive or use heavy machinery while taking prescription pain medicine.  For 3 days, or as long as your doctor tells you, avoid: ? Douching. ? Using tampons. ? Having sex.  If you use birth control (contraception), keep using it.  Limit activity for the first day after the procedure. Ask your doctor what activities are safe for you.  It is up to you to get the results of your procedure. Ask your doctor when your results will be ready.  Keep all follow-up visits as told by your doctor. This is important. Contact a doctor if:  You get a skin rash. Get help right away if:  You are bleeding a lot from your vagina. It is a lot of bleeding if you are using more than one pad an hour for 2 hours in a row.  You have clumps of blood (blood clots) coming from your vagina.  You have a fever.  You have chills  You have pain in your lower belly (pelvic area).  You have signs of infection, such as vaginal  discharge that is: ? Different than usual. ? Yellow. ? Bad-smelling.  You have very pain or cramps in your lower belly that do not get better with medicine.  You feel light-headed.  You feel dizzy.  You pass out (faint). Summary  If you did not have a tissue sample removed (did not have a biopsy), you may only have some spotting for a few days. You can go back to your normal activities.  If you had a tissue sample removed, it is common to have mild pain and spotting for 48 hours.  For 3 days, or as long as your doctor tells you, avoid douching, using tampons and having sex.  Get help right away if you have bleeding, very bad pain, or signs of infection. This information is not intended to replace advice given to you by your health care provider. Make sure you discuss any questions you have with your health care provider. Document Revised: 10/22/2017 Document Reviewed: 07/29/2016 Elsevier Patient Education  2020 [employer] Inc.  

## 2019-11-29 NOTE — Progress Notes (Signed)
Pt is here today for colpo procedure. Pap smear on 11/07/19: positive high risk HPV.

## 2019-11-29 NOTE — Addendum Note (Signed)
Addended by: Chancy Milroy on: 11/29/2019 03:47 PM   Modules accepted: Orders

## 2019-12-04 LAB — SURGICAL PATHOLOGY

## 2020-01-03 ENCOUNTER — Ambulatory Visit
Admission: RE | Admit: 2020-01-03 | Discharge: 2020-01-03 | Disposition: A | Discharge status: Home / Self Care | Payer: Medicaid Other | Source: Ambulatory Visit | Attending: Certified Nurse Midwife | Admitting: Certified Nurse Midwife

## 2020-01-03 ENCOUNTER — Other Ambulatory Visit: Payer: Self-pay

## 2020-01-03 DIAGNOSIS — Z01419 Encounter for gynecological examination (general) (routine) without abnormal findings: Secondary | ICD-10-CM

## 2020-01-21 ENCOUNTER — Encounter (HOSPITAL_COMMUNITY): Payer: Self-pay | Admitting: Emergency Medicine

## 2020-01-21 ENCOUNTER — Emergency Department (HOSPITAL_COMMUNITY)
Admission: EM | Admit: 2020-01-21 | Discharge: 2020-01-21 | Disposition: A | Discharge status: Home / Self Care | Payer: Medicaid Other | Attending: Emergency Medicine | Admitting: Emergency Medicine

## 2020-01-21 ENCOUNTER — Other Ambulatory Visit: Payer: Self-pay

## 2020-01-21 DIAGNOSIS — Z8673 Personal history of transient ischemic attack (TIA), and cerebral infarction without residual deficits: Secondary | ICD-10-CM | POA: Insufficient documentation

## 2020-01-21 DIAGNOSIS — Z87891 Personal history of nicotine dependence: Secondary | ICD-10-CM | POA: Diagnosis not present

## 2020-01-21 DIAGNOSIS — R21 Rash and other nonspecific skin eruption: Secondary | ICD-10-CM | POA: Diagnosis present

## 2020-01-21 DIAGNOSIS — I10 Essential (primary) hypertension: Secondary | ICD-10-CM | POA: Insufficient documentation

## 2020-01-21 DIAGNOSIS — B370 Candidal stomatitis: Secondary | ICD-10-CM | POA: Insufficient documentation

## 2020-01-21 DIAGNOSIS — Z79899 Other long term (current) drug therapy: Secondary | ICD-10-CM | POA: Diagnosis not present

## 2020-01-21 LAB — RPR: RPR Ser Ql: NONREACTIVE

## 2020-01-21 LAB — HIV ANTIBODY (ROUTINE TESTING W REFLEX): HIV Screen 4th Generation wRfx: NONREACTIVE

## 2020-01-21 NOTE — ED Triage Notes (Signed)
Patient is complaining of having thrush and taking her nystatin wrong. Patient also wants to make sure it is not syphilis.
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
## 2020-01-21 NOTE — Discharge Instructions (Addendum)
Syphilis and HIV testing has been sent.  You will be notified in about 48 to 72 hours if these are abnormal. Continue using your nystatin.  Make sure to rinse your mouth after eating, try to reduce sugary food and drink intake. Follow-up with your primary care doctor. Return here for any new or acute changes.

## 2020-02-19 ENCOUNTER — Encounter (HOSPITAL_COMMUNITY): Payer: Self-pay

## 2020-02-19 ENCOUNTER — Other Ambulatory Visit: Payer: Self-pay

## 2020-02-19 DIAGNOSIS — R140 Abdominal distension (gaseous): Secondary | ICD-10-CM | POA: Diagnosis not present

## 2020-02-19 DIAGNOSIS — Z5321 Procedure and treatment not carried out due to patient leaving prior to being seen by health care provider: Secondary | ICD-10-CM | POA: Diagnosis not present

## 2020-02-19 DIAGNOSIS — R110 Nausea: Secondary | ICD-10-CM | POA: Insufficient documentation

## 2020-02-19 NOTE — ED Triage Notes (Signed)
Patient arrived POV with c/o intermittent painless abdominal bloating and mild nausea X2 weeks. Patient thinks it may be associated with the medication she was taking for her thrush, because she was taking medication incorrectly. Denies diarrhea or constipation

## 2020-02-20 ENCOUNTER — Ambulatory Visit: Payer: Medicaid Other | Admitting: Obstetrics

## 2020-02-20 ENCOUNTER — Encounter: Payer: Self-pay | Admitting: Obstetrics

## 2020-02-20 ENCOUNTER — Emergency Department (HOSPITAL_COMMUNITY)
Admission: EM | Admit: 2020-02-20 | Discharge: 2020-02-20 | Disposition: A | Discharge status: Home / Self Care | Payer: Medicaid Other | Attending: Emergency Medicine | Admitting: Emergency Medicine

## 2020-02-20 VITALS — BP 138/94 | HR 71 | Wt 170.0 lb

## 2020-02-20 DIAGNOSIS — R1084 Generalized abdominal pain: Secondary | ICD-10-CM
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
## 2020-05-02 ENCOUNTER — Ambulatory Visit (HOSPITAL_BASED_OUTPATIENT_CLINIC_OR_DEPARTMENT_OTHER): Payer: Medicaid Other

## 2020-05-02 ENCOUNTER — Other Ambulatory Visit (HOSPITAL_COMMUNITY)
Admission: RE | Admit: 2020-05-02 | Discharge: 2020-05-02 | Disposition: A | Discharge status: Home / Self Care | Payer: Medicaid Other | Source: Ambulatory Visit | Attending: Obstetrics | Admitting: Obstetrics

## 2020-05-02 ENCOUNTER — Other Ambulatory Visit: Payer: Self-pay

## 2020-05-02 DIAGNOSIS — Z113 Encounter for screening for infections with a predominantly sexual mode of transmission: Secondary | ICD-10-CM | POA: Diagnosis not present

## 2020-05-02 NOTE — Progress Notes (Signed)
[name] is here for STD screening.  Pt has concerns of being exposed to STD's. Results pending. -EH/RMA

## 2020-05-03 LAB — CERVICOVAGINAL ANCILLARY ONLY
Bacterial Vaginitis (gardnerella): NEGATIVE
Candida Glabrata: NEGATIVE
Candida Vaginitis: NEGATIVE
Chlamydia: NEGATIVE
Comment: NEGATIVE
Comment: NEGATIVE
Comment: NEGATIVE
Comment: NEGATIVE
Comment: NEGATIVE
Comment: NORMAL
Neisseria Gonorrhea: NEGATIVE
Trichomonas: NEGATIVE

## 2020-05-03 LAB — HIV ANTIBODY (ROUTINE TESTING W REFLEX): HIV Screen 4th Generation wRfx: NONREACTIVE

## 2020-05-03 LAB — HEPATITIS C ANTIBODY: Hep C Virus Ab: <0.1 s/co ratio (ref 0.0–0.9)

## 2020-05-03 LAB — RPR: RPR Ser Ql: NONREACTIVE

## 2020-11-26 ENCOUNTER — Encounter: Payer: Self-pay | Admitting: Obstetrics

## 2020-11-26 ENCOUNTER — Other Ambulatory Visit (HOSPITAL_COMMUNITY)
Admission: RE | Admit: 2020-11-26 | Discharge: 2020-11-26 | Disposition: A | Discharge status: Home / Self Care | Payer: Medicaid Other | Source: Ambulatory Visit | Attending: Obstetrics | Admitting: Obstetrics

## 2020-11-26 ENCOUNTER — Ambulatory Visit (INDEPENDENT_AMBULATORY_CARE_PROVIDER_SITE_OTHER): Payer: Medicaid Other | Admitting: Obstetrics

## 2020-11-26 ENCOUNTER — Other Ambulatory Visit: Payer: Self-pay

## 2020-11-26 VITALS — BP 129/84 | HR 68 | Wt 177.8 lb

## 2020-11-26 DIAGNOSIS — R140 Abdominal distension (gaseous): Secondary | ICD-10-CM | POA: Diagnosis not present

## 2020-11-26 DIAGNOSIS — Z113 Encounter for screening for infections with a predominantly sexual mode of transmission: Secondary | ICD-10-CM

## 2020-11-26 DIAGNOSIS — Z01419 Encounter for gynecological examination (general) (routine) without abnormal findings: Secondary | ICD-10-CM

## 2020-11-26 DIAGNOSIS — Z800 Family history of malignant neoplasm of digestive organs: Secondary | ICD-10-CM

## 2020-11-26 DIAGNOSIS — N898 Other specified noninflammatory disorders of vagina: Secondary | ICD-10-CM | POA: Insufficient documentation

## 2020-11-26 DIAGNOSIS — Z3009 Encounter for other general counseling and advice on contraception: Secondary | ICD-10-CM

## 2020-11-26 DIAGNOSIS — E669 Obesity, unspecified: Secondary | ICD-10-CM

## 2020-11-26 DIAGNOSIS — I10 Essential (primary) hypertension: Secondary | ICD-10-CM

## 2020-11-26 LAB — POCT URINE PREGNANCY: Preg Test, Ur: NEGATIVE

## 2020-11-26 NOTE — Addendum Note (Signed)
Addended by: Dalphine Handing on: 11/26/2020 04:35 PM   Modules accepted: Orders
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
## 2020-11-26 NOTE — Progress Notes (Addendum)
Pt presents for annual and all STD testing Normal Mammogram on 01/03/20 Neg pap with HR HPV on 11/07/2019 Normal colposcopy on 11/29/2019 Last period was in AUG UPT = neg

## 2020-11-27 ENCOUNTER — Other Ambulatory Visit: Payer: Self-pay | Admitting: Obstetrics

## 2020-11-27 DIAGNOSIS — B9689 Other specified bacterial agents as the cause of diseases classified elsewhere: Secondary | ICD-10-CM

## 2020-11-27 LAB — CERVICOVAGINAL ANCILLARY ONLY
Bacterial Vaginitis (gardnerella): POSITIVE — AB
Candida Glabrata: NEGATIVE
Candida Vaginitis: POSITIVE — AB
Chlamydia: NEGATIVE
Comment: NEGATIVE
Comment: NEGATIVE
Comment: NEGATIVE
Comment: NEGATIVE
Comment: NEGATIVE
Comment: NORMAL
Neisseria Gonorrhea: NEGATIVE
Trichomonas: NEGATIVE

## 2020-11-27 LAB — HIV ANTIBODY (ROUTINE TESTING W REFLEX): HIV Screen 4th Generation wRfx: NONREACTIVE

## 2020-11-27 LAB — HEPATITIS C ANTIBODY: Hep C Virus Ab: <0.1 s/co ratio (ref 0.0–0.9)

## 2020-11-27 LAB — RPR: RPR Ser Ql: NONREACTIVE

## 2020-11-27 LAB — HEPATITIS B SURFACE ANTIGEN: Hepatitis B Surface Ag: NEGATIVE

## 2020-11-27 MED ORDER — METRONIDAZOLE 500 MG PO TABS: 500.0000 mg | ORAL_TABLET | Freq: Two times a day (BID) | ORAL | 2 refills | Status: DC

## 2020-12-02 LAB — CYTOLOGY - PAP
Comment: NEGATIVE
Diagnosis: UNDETERMINED — AB
High risk HPV: NEGATIVE

## 2020-12-03 ENCOUNTER — Other Ambulatory Visit: Payer: Self-pay

## 2020-12-03 ENCOUNTER — Ambulatory Visit
Admission: RE | Admit: 2020-12-03 | Discharge: 2020-12-03 | Disposition: A | Discharge status: Home / Self Care | Payer: Medicaid Other | Source: Ambulatory Visit | Attending: Obstetrics | Admitting: Obstetrics

## 2020-12-03 DIAGNOSIS — R140 Abdominal distension (gaseous): Secondary | ICD-10-CM | POA: Diagnosis not present

## 2020-12-17 ENCOUNTER — Telehealth: Payer: Self-pay | Admitting: *Deleted

## 2020-12-17 NOTE — Telephone Encounter (Signed)
Pt called to office for u/s results.    Please review and contact pt with results and recommendations.  You can contact pt at [phone] or [phone].

## 2020-12-17 NOTE — Telephone Encounter (Signed)
Message to scheduling to call pt and set up appt.

## 2020-12-17 NOTE — Telephone Encounter (Signed)
Please schedule patient for appointment for an endometrial biopsy.  I discussed results of ultrasound with her.

## 2020-12-25 ENCOUNTER — Ambulatory Visit: Payer: Medicaid Other | Admitting: Obstetrics

## 2020-12-25 ENCOUNTER — Ambulatory Visit (INDEPENDENT_AMBULATORY_CARE_PROVIDER_SITE_OTHER): Payer: Medicaid Other | Admitting: Obstetrics

## 2020-12-25 ENCOUNTER — Encounter: Payer: Self-pay | Admitting: Obstetrics

## 2020-12-25 ENCOUNTER — Other Ambulatory Visit (HOSPITAL_COMMUNITY)
Admission: RE | Admit: 2020-12-25 | Discharge: 2020-12-25 | Disposition: A | Discharge status: Home / Self Care | Payer: Medicaid Other | Source: Ambulatory Visit | Attending: Obstetrics | Admitting: Obstetrics

## 2020-12-25 ENCOUNTER — Other Ambulatory Visit: Payer: Self-pay

## 2020-12-25 VITALS — BP 140/89 | HR 67 | Wt 177.7 lb

## 2020-12-25 DIAGNOSIS — R9389 Abnormal findings on diagnostic imaging of other specified body structures: Secondary | ICD-10-CM

## 2020-12-25 LAB — POCT URINE PREGNANCY: Preg Test, Ur: NEGATIVE

## 2020-12-25 NOTE — Progress Notes (Signed)
Endometrial Biopsy Procedure Note  Pre-operative Diagnosis: Endometrial Thickening on Ultrasound  Post-operative Diagnosis: same  Indications: Abdominal Bloating  Procedure Details   Urine pregnancy test was done in office and result was negative.  The risks (including infection, bleeding, pain, and uterine perforation) and benefits of the procedure were explained to the patient and Written informed consent was obtained.    The patient was placed in the dorsal lithotomy position.  Bimanual exam showed the uterus to be in the neutral position.  A Graves' speculum inserted in the vagina, and the cervix prepped with povidone iodine.  Endocervical curettage with a Kevorkian curette was not performed.   A sharp tenaculum was applied to the anterior lip of the cervix for stabilization.  A sterile uterine sound was used to sound the uterus to a depth of 7.5cm.  A Pipelle endometrial aspirator was used to sample the endometrium.  Sample was sent for pathologic examination.  Condition: Stable  Complications: None  Plan:  The patient was advised to call for any fever or for prolonged or severe pain or bleeding. She was advised to use NSAID as needed for mild to moderate pain. She was advised to avoid vaginal intercourse for 48 hours or until the bleeding has completely stopped.  Attending Physician Documentation: I was present for or participated in the entire procedure, including opening and closing.   Shelly Bombard, MD 12/25/2020 4:39 PM

## 2020-12-25 NOTE — Progress Notes (Signed)
Pt presents for endometrial bx Thickness 13.6 mm on 12/03/2020 u/s  UPT neg

## 2020-12-27 LAB — SURGICAL PATHOLOGY

## 2021-01-08 ENCOUNTER — Encounter: Payer: Self-pay | Admitting: Obstetrics

## 2021-01-08 ENCOUNTER — Telehealth (INDEPENDENT_AMBULATORY_CARE_PROVIDER_SITE_OTHER): Payer: Medicaid Other | Admitting: Obstetrics

## 2021-01-08 DIAGNOSIS — E669 Obesity, unspecified: Secondary | ICD-10-CM | POA: Diagnosis not present

## 2021-01-08 DIAGNOSIS — R9389 Abnormal findings on diagnostic imaging of other specified body structures: Secondary | ICD-10-CM

## 2021-01-08 DIAGNOSIS — I10 Essential (primary) hypertension: Secondary | ICD-10-CM | POA: Diagnosis not present

## 2021-01-08 MED ORDER — MEGESTROL ACETATE 20 MG PO TABS: 20.0000 mg | ORAL_TABLET | Freq: Every day | ORAL | 2 refills | Status: AC

## 2021-01-08 NOTE — Progress Notes (Signed)
S/w pt for virtual visit. Pt is wanting to discuss results from endometrial biopsy done on 12/25/20.
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
## 2021-01-10 ENCOUNTER — Telehealth: Payer: Self-pay | Admitting: Obstetrics

## 2021-01-10 NOTE — Telephone Encounter (Signed)
Telephone call to patient to discuss incident that happened in the office on 01/08/21.  Patient came to the office upset following a virtual visit and was told she could not see Dr. Jodi Mourning, but would have to schedule an appointment.  Patient stated she no longer had any questions.  She had googled her new prescription and was "alarmed to find out that her new medication was used for AIDS patients".  So she thought that maybe she had this diagnosis.    Patient states she now has no concerns or questions about her medication.

## 2021-01-31 ENCOUNTER — Ambulatory Visit (AMBULATORY_SURGERY_CENTER): Payer: Self-pay

## 2021-01-31 ENCOUNTER — Other Ambulatory Visit: Payer: Self-pay

## 2021-01-31 VITALS — Ht 59.5 in | Wt 178.0 lb

## 2021-01-31 DIAGNOSIS — Z800 Family history of malignant neoplasm of digestive organs: Secondary | ICD-10-CM

## 2021-01-31 MED ORDER — NA SULFATE-K SULFATE-MG SULF 17.5-3.13-1.6 GM/177ML PO SOLN: 1.0000 | Freq: Once | ORAL | 0 refills | Status: AC

## 2021-01-31 NOTE — Progress Notes (Signed)
No egg or soy allergy known to patient  No issues with past sedation with any surgeries or procedures No intubation problems in the past  No FH of Malignant Hyperthermia No diet pills per patient No home 02 use per patient  No blood thinners per patient  Pt denies issues with constipation  No A fib or A flutter  EMMI video via [place] 19 guidelines implemented in PV today with Pt and RN  Coupon given to pt in PV today , Code to Pharmacy and  NO PA's for preps discussed with pt In PV today  Discussed with pt there will be an out-of-pocket cost for prep and that varies from $0 to 70 dollars  Due to the COVID-19 pandemic we are asking patients to follow certain guidelines.   Pt aware of COVID protocols and [facility] guidelines

## 2021-02-14 ENCOUNTER — Encounter: Payer: Medicaid Other | Admitting: Gastroenterology

## 2021-06-10 IMAGING — US US PELVIS COMPLETE WITH TRANSVAGINAL
1 series · 14 of 25 positions shown · non-contrast
Comparison: Prior sonograms, OB sonograms.
COMPARISON: Prior sonograms, OB sonograms.

Addendum:
CLINICAL DATA: Abdominal bloating

EXAM:
TRANSABDOMINAL AND TRANSVAGINAL ULTRASOUND OF PELVIS
TECHNIQUE: Both transabdominal and transvaginal ultrasound examinations of the
pelvis were performed. Transabdominal technique was performed for
global imaging of the pelvis including uterus, ovaries, adnexal
regions, and pelvic cul-de-sac. It was necessary to proceed with
endovaginal exam following the transabdominal exam to visualize the
uterus and adnexa, also the endometrium.

[Series 1: us pelvis complete with transvaginal · 71 acquisitions, 14 frames shown]
[im 1/71]
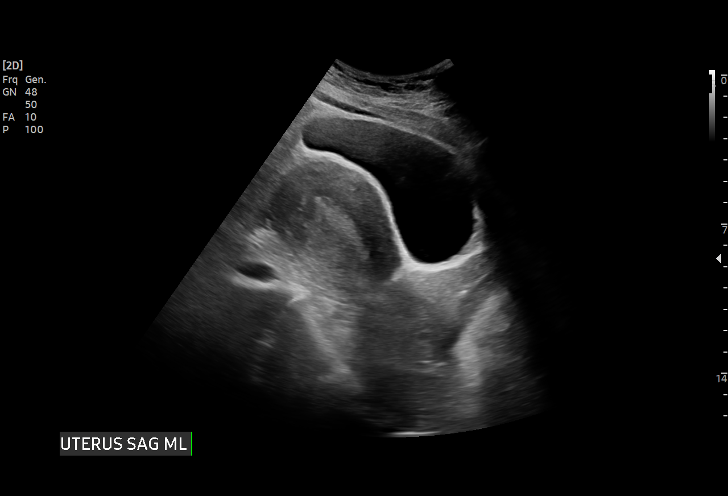
[im 6/71]
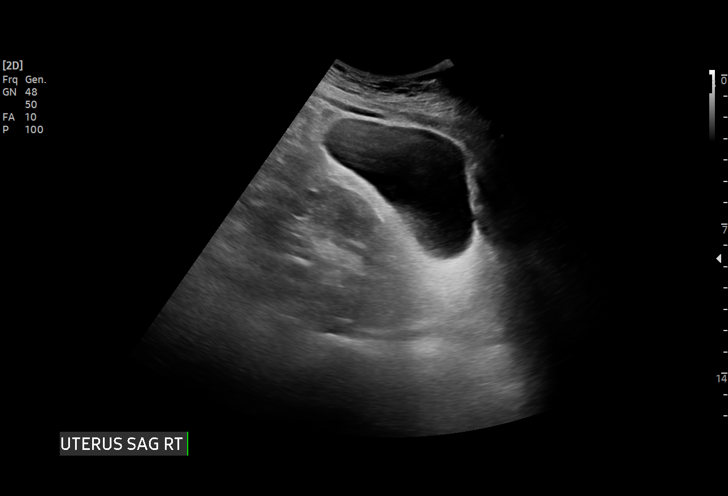
[im 12/71]
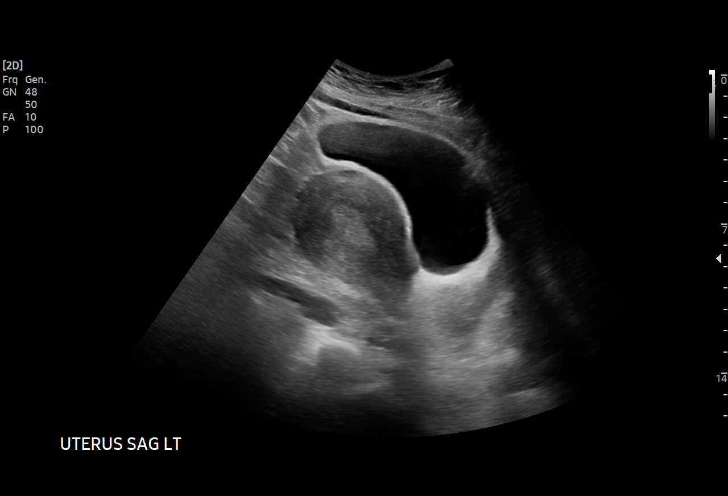
[im 18/71]
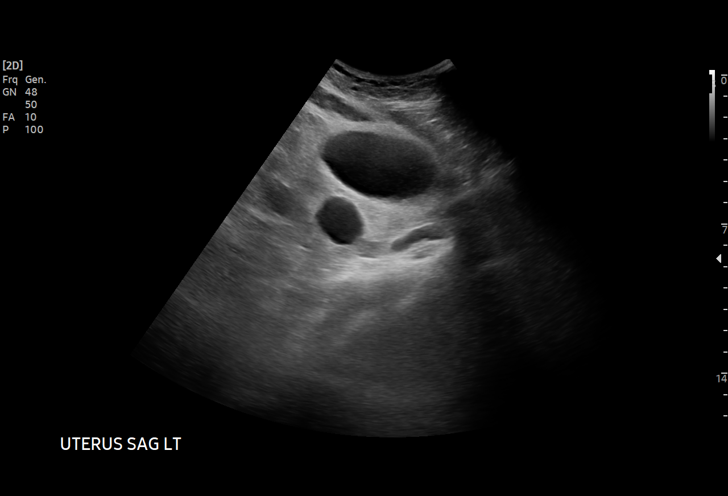
[im 24/71]
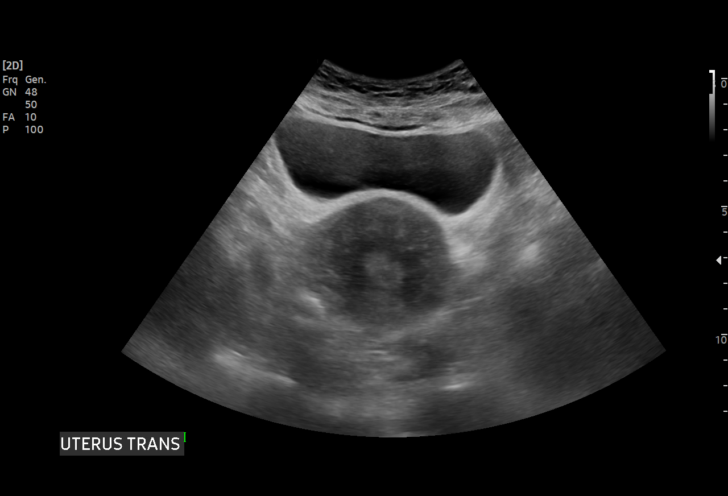
[im 27/71]
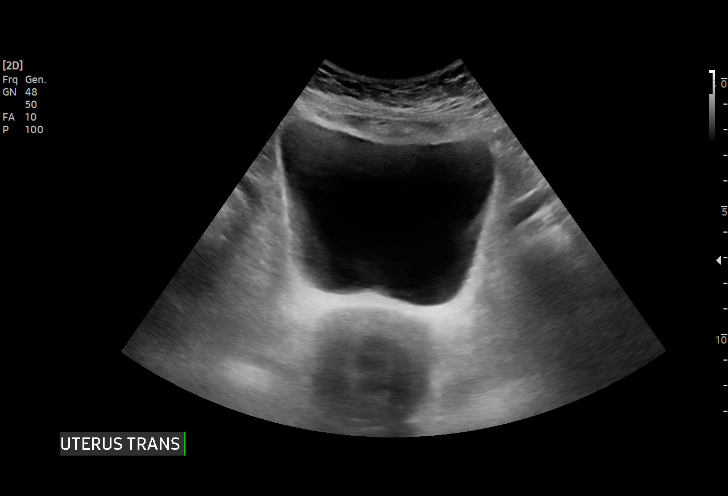
[im 33/71]
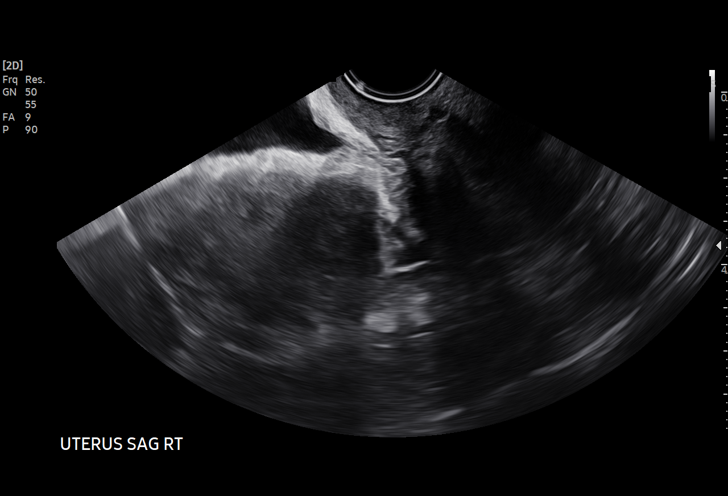
[im 38/71]
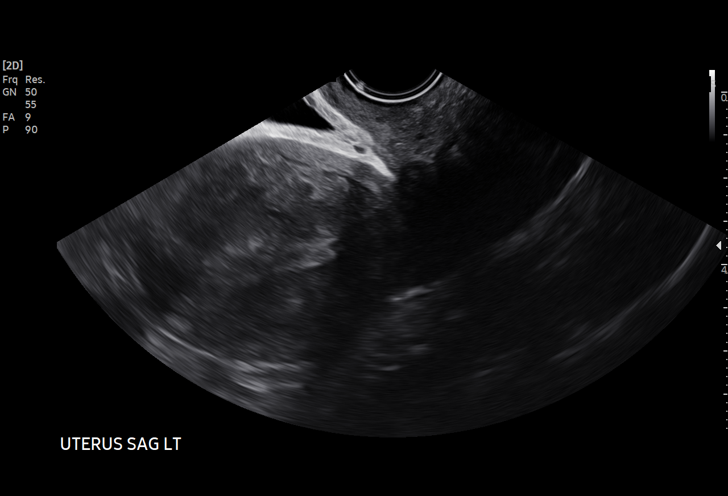
[im 44/71]
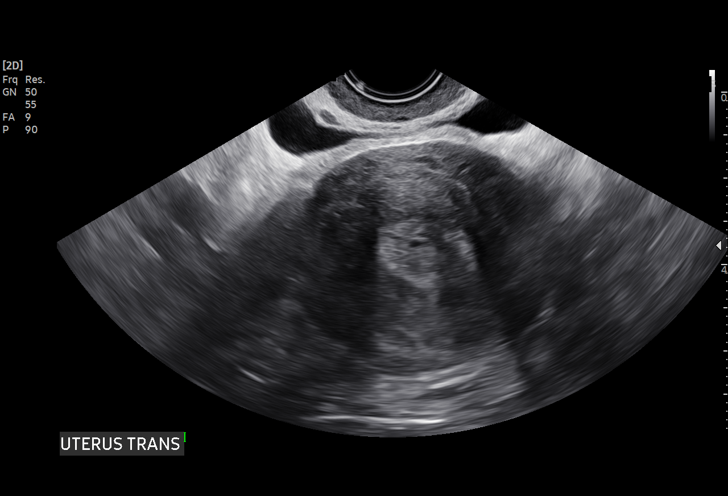
[im 47/71]
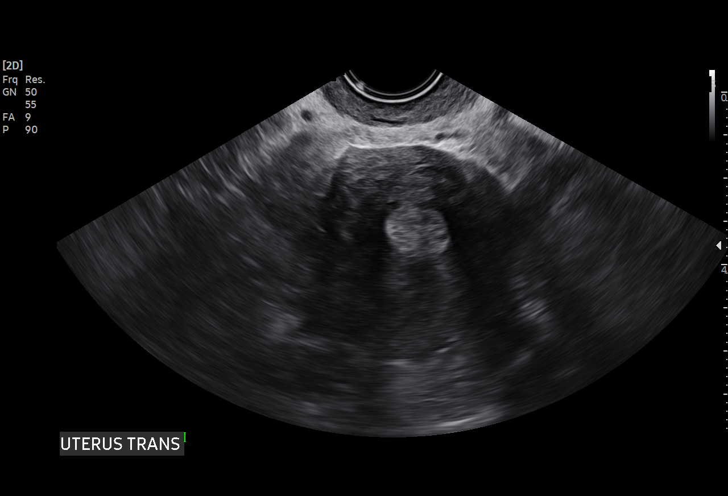
[im 53/71]
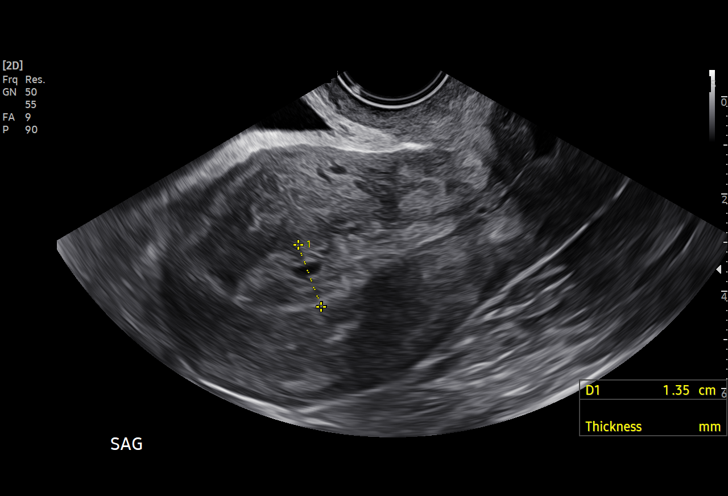
[im 59/71]
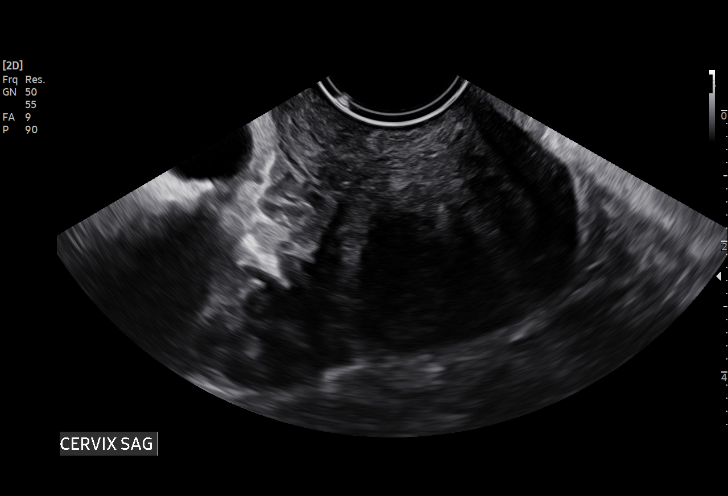
[im 65/71]
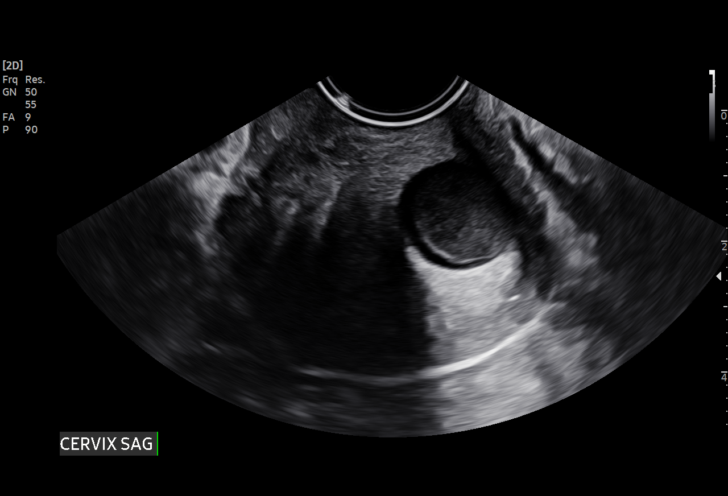
[im 71/71]
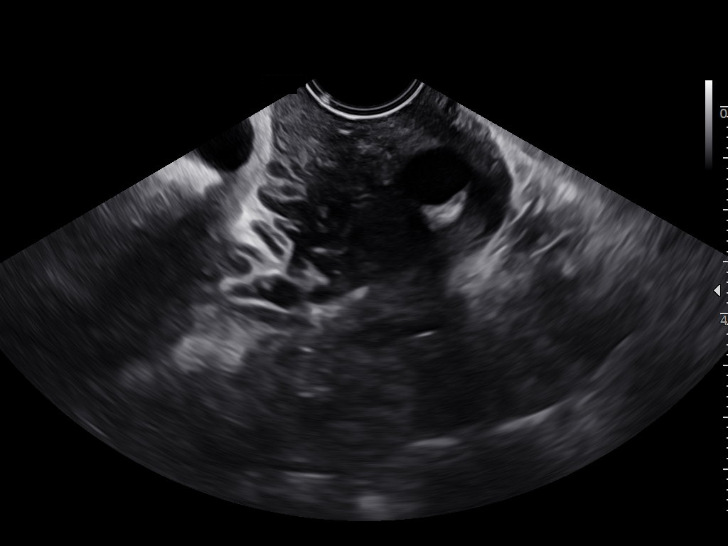

[14 of 25 positions shown; findings below may reference images not displayed]

FINDINGS: Uterus

Measurements: 7.6 x 5.3 x 6.5 cm = volume: 137 mL. No fibroids or
other mass visualized.

In the cervix there is a 1.7 x 1.9 x 1.7 cm area with enhanced
through transmission and well-circumscribed margins. Some low level
internal echoes are demonstrated.

Endometrium

Thickness: 13.6 mm.  No focal abnormality visualized.

Right ovary

Not visualized

Left ovary

Not visualized

Other findings

No abnormal free fluid.
IMPRESSION: Nabothian cyst with hemorrhagic or proteinaceous contents.

Ovaries not visualized.

ADDENDUM:
In addition of findings above the endometrium is mildly
heterogeneous with suggestion of small area of cystic change in the
mid uterus within the endometrium. No visible mass on standard
pelvic sonogram. Endometrial thickness at 13.6 mm. Small amount of
fluid in the cervix.

Ultimately with heterogeneity of the endometrium and small area of
cystic change the possibility of hyperplasia is considered in this
patient who has reportedly not had a normal cycle since [REDACTED] of
this year. If there is continued irregularity of the patient's cycle
or development of abnormal uterine bleeding, sonohysterogram should
be considered for focal lesion work-up. (Ref: Radiological
Reasoning: Algorithmic Workup of Abnormal Vaginal Bleeding with
Endovaginal Sonography and Sonohysterography. AJR [id number]; 191:S68-73)

*** End of Addendum ***
FINDINGS: Uterus

Measurements: 7.6 x 5.3 x 6.5 cm = volume: 137 mL. No fibroids or
other mass visualized.

In the cervix there is a 1.7 x 1.9 x 1.7 cm area with enhanced
through transmission and well-circumscribed margins. Some low level
internal echoes are demonstrated.

Endometrium

Thickness: 13.6 mm.  No focal abnormality visualized.

Right ovary

Not visualized

Left ovary

Not visualized

Other findings

No abnormal free fluid.
IMPRESSION: Nabothian cyst with hemorrhagic or proteinaceous contents.

Ovaries not visualized.

## 2022-07-20 ENCOUNTER — Encounter: Payer: Self-pay | Admitting: Advanced Practice Midwife

## 2022-07-20 ENCOUNTER — Ambulatory Visit (INDEPENDENT_AMBULATORY_CARE_PROVIDER_SITE_OTHER): Payer: Medicaid Other | Admitting: Advanced Practice Midwife

## 2022-07-20 ENCOUNTER — Other Ambulatory Visit (HOSPITAL_COMMUNITY)
Admission: RE | Admit: 2022-07-20 | Discharge: 2022-07-20 | Disposition: A | Discharge status: Home / Self Care | Payer: Medicaid Other | Source: Ambulatory Visit | Attending: Advanced Practice Midwife | Admitting: Advanced Practice Midwife

## 2022-07-20 VITALS — BP 149/94 | HR 98 | Ht 59.0 in | Wt 182.0 lb

## 2022-07-20 DIAGNOSIS — Z124 Encounter for screening for malignant neoplasm of cervix: Secondary | ICD-10-CM | POA: Diagnosis not present

## 2022-07-20 DIAGNOSIS — Z0000 Encounter for general adult medical examination without abnormal findings: Secondary | ICD-10-CM

## 2022-07-20 DIAGNOSIS — N924 Excessive bleeding in the premenopausal period: Secondary | ICD-10-CM

## 2022-07-20 DIAGNOSIS — Z1231 Encounter for screening mammogram for malignant neoplasm of breast: Secondary | ICD-10-CM

## 2022-07-20 DIAGNOSIS — Z113 Encounter for screening for infections with a predominantly sexual mode of transmission: Secondary | ICD-10-CM | POA: Insufficient documentation

## 2022-07-20 DIAGNOSIS — Z01419 Encounter for gynecological examination (general) (routine) without abnormal findings: Secondary | ICD-10-CM

## 2022-07-20 DIAGNOSIS — R87610 Atypical squamous cells of undetermined significance on cytologic smear of cervix (ASC-US): Secondary | ICD-10-CM | POA: Insufficient documentation

## 2022-07-20 NOTE — Progress Notes (Addendum)
51 y.o GYN presents for AEX/PAP/STD screening. Wants to discuss US done in 2022.

## 2022-07-20 NOTE — Progress Notes (Signed)
Subjective:     [name] is a 51 y.o. female here at [facility] for a routine exam.  Current complaints: no menses x 6-7 months, last year had abdominal cramping, irregular periods, and bloating but symptoms resolved. Occasional bloating pt attributes to soda drinking.  Personal health questionnaire reviewed: yes.  Do you have a primary care provider? yes Do you feel safe at home? yes  [place] Visit from 07/20/2022 in [place]  PHQ-2 Total Score 1       Health Maintenance Due  Topic Date Due   COVID-19 Vaccine (1) Never done   TETANUS/TDAP  Never done   COLONOSCOPY (Pts 45-36yr Insurance coverage will need to be confirmed)  Never done   MAMMOGRAM  01/02/2021   INFLUENZA VACCINE  06/23/2022     Risk factors for chronic health problems: Smoking: Quit 2014 Alchohol/how much: None Pt BMI: Body mass index is 36.76 kg/m.   Gynecologic History No LMP recorded. Patient is perimenopausal. Contraception: condoms Last Pap: 12/2020. Results were: ASCUS with neg HPV Last mammogram: 01/03/2020. Results were: normal  Obstetric History OB History  Gravida Para Term Preterm AB Living  '6 5 4   1 4  '$ SAB IAB Ectopic Multiple Live Births    1     4    # Outcome Date GA Lbr Len/2nd Weight Sex Delivery Anes PTL Lv  6 IAB 04/13/12          5 Term 09/10/04 322w0d F Vag-Spont   LIV  4 Para 04/11/03    M Vag-Spont   FD  3 Term 11/14/98 [id number] Vag-Spont   LIV  2 Term 04/03/94 [id number] Vag-Spont   LIV  1 Term 09/14/89 [id number]-Spont   LIV     The following portions of the patient's history were reviewed and updated as appropriate: allergies, current medications, past family history, past medical history, past social history, past surgical history, and problem list.  Review of Systems Pertinent items noted in HPI and remainder of comprehensive ROS otherwise negative.    Objective:   BP (!) 149/94 (BP Location: Right Arm, Cuff  Size: Large)   Pulse 98   Ht '4\' 11"'$  (1.499 m)   Wt 182 lb (82.6 kg)   BMI 36.76 kg/m  VS reviewed, nursing note reviewed,  Constitutional: well developed, well nourished, no distress HEENT: normocephalic CV: normal rate Pulm/chest wall: normal effort Breast Exam:  exam performed: right breast normal without mass, skin or nipple changes or axillary nodes, left breast normal without mass, skin or nipple changes or axillary nodes Abdomen: soft Neuro: alert and oriented x 3 Skin: warm, dry Psych: affect normal Pelvic exam: Performed: Cervix pink, visually closed, without lesion, scant white creamy discharge, vaginal walls and external genitalia normal Bimanual exam: Cervix 0/long/high, firm, anterior, neg CMT, uterus nontender, nonenlarged, adnexa without tenderness, enlargement, or mass       Assessment/Plan:   1. Routine screening for STI (sexually transmitted infection)  - Cervicovaginal ancillary only( [place]) - Hepatitis C Antibody - HIV antibody (with reflex) - RPR  2. Cervical cancer screening --ASCUS with neg HPV in 2022, repeat today --  - Cytology - PAP( [place])  3. Breast cancer screening by mammogram  - MM 3D SCREEN BREAST BILATERAL; Future    5. Abnormal perimenopausal bleeding --Evaluation of AUB with heavy menses in 2022, but last period was Feb  or March 2023, no bleeding since then --Likely r/t menopause, dx at 1 year without menses --Exam wnl in office today, reviewed pt [place] with thickened endometrium and normal endometrial bx results from last year.   --Consult Dr Elly Modena, with neg biopsy and no menses x 6-7 months, no need for follow up unless bleeding after 1 year of no periods/postmenopausal bleeding   6. Well woman exam with routine gynecological exam    No follow-ups on file.   Fatima Blank, CNM 1:41 PM

## 2022-07-21 LAB — CERVICOVAGINAL ANCILLARY ONLY
Chlamydia: NEGATIVE
Comment: NEGATIVE
Comment: NEGATIVE
Comment: NORMAL
Neisseria Gonorrhea: NEGATIVE
Trichomonas: NEGATIVE

## 2022-07-21 LAB — HIV ANTIBODY (ROUTINE TESTING W REFLEX): HIV Screen 4th Generation wRfx: NONREACTIVE

## 2022-07-21 LAB — HEPATITIS C ANTIBODY: Hep C Virus Ab: NONREACTIVE

## 2022-07-21 LAB — RPR: RPR Ser Ql: NONREACTIVE

## 2022-07-30 ENCOUNTER — Emergency Department (HOSPITAL_COMMUNITY)
Admission: EM | Admit: 2022-07-30 | Discharge: 2022-07-30 | Disposition: A | Discharge status: Home / Self Care | Payer: Medicaid Other | Attending: Emergency Medicine | Admitting: Emergency Medicine

## 2022-07-30 ENCOUNTER — Telehealth: Payer: Self-pay | Admitting: *Deleted

## 2022-07-30 ENCOUNTER — Other Ambulatory Visit: Payer: Self-pay

## 2022-07-30 ENCOUNTER — Encounter (HOSPITAL_COMMUNITY): Payer: Self-pay

## 2022-07-30 DIAGNOSIS — Z760 Encounter for issue of repeat prescription: Secondary | ICD-10-CM | POA: Diagnosis not present

## 2022-07-30 DIAGNOSIS — I119 Hypertensive heart disease without heart failure: Secondary | ICD-10-CM

## 2022-07-30 DIAGNOSIS — Z79899 Other long term (current) drug therapy: Secondary | ICD-10-CM | POA: Diagnosis not present

## 2022-07-30 DIAGNOSIS — I10 Essential (primary) hypertension: Secondary | ICD-10-CM | POA: Diagnosis present

## 2022-07-30 LAB — I-STAT CHEM 8, ED
BUN: 16 mg/dL (ref 6–20)
Calcium, Ion: 1.25 mmol/L (ref 1.15–1.40)
Chloride: 105 mmol/L (ref 98–111)
Creatinine, Ser: 1.3 mg/dL — ABNORMAL HIGH (ref 0.44–1.00)
Glucose, Bld: 82 mg/dL (ref 70–99)
HCT: 40 % (ref 36.0–46.0)
Hemoglobin: 13.6 g/dL (ref 12.0–15.0)
Potassium: 3.5 mmol/L (ref 3.5–5.1)
Sodium: 142 mmol/L (ref 135–145)
TCO2: 24 mmol/L (ref 22–32)

## 2022-07-30 LAB — CYTOLOGY - PAP
Adequacy: ABSENT
Comment: NEGATIVE
Diagnosis: UNDETERMINED — AB
High risk HPV: NEGATIVE

## 2022-07-30 MED ORDER — LISINOPRIL 10 MG PO TABS: 10.0000 mg | ORAL_TABLET | Freq: Every day | ORAL | 0 refills | Status: AC

## 2022-07-30 NOTE — Discharge Instructions (Signed)
You are seen in the ER for elevated blood pressure. Your kidney function is slightly worse than last year, could be because of poor control of hypertension.  We recommend that you start taking lisinopril that has been prescribed today and call your PCP for close this appointment.

## 2022-07-30 NOTE — ED Triage Notes (Addendum)
Patient states she has been having a systolic BP reading  244-975'P. Patient states she is prescribed Lisinopril, but has not taken in 2 months because she "thought she was better." Patient states her PCP will not be back in the office until 08/03/22.  Patient states that she has had a mild headache.

## 2022-07-30 NOTE — Telephone Encounter (Signed)
TC from pt reporting severe range BP's 180's/100's. Advised pt to seek care immediately. Denies current neurologic symptoms. Instructed to have someone else drive her to the emergency department. Pt verbalized understanding.

## 2022-07-30 NOTE — ED Provider Notes (Signed)
[facility] DEPT Provider Note   CSN: [id number] Arrival date & time: 07/30/22  4098     History  Chief Complaint  Patient presents with   Hypertension    [name] is a 51 y.o. female.  HPI    51 year old female comes in with chief complaint of elevated blood pressure.  Patient indicates that she had gone to her OB/GYN 2 days ago and her BP was in the 180s.  Over the last 2 months she has not taken her lisinopril 10 mg, as she has run out of it and she has been feeling well.  She has been feeling slightly weak, she checked her blood pressure again today and it was running high, prompting her to come to the ER.  Patient does not have any active chest pain, shortness of breath, headaches, focal weakness, numbness, vision change.  She called her PCP, but they will not be open until later this month.  Home Medications Prior to Admission medications   Medication Sig Start Date End Date Taking? Authorizing Provider  lisinopril (ZESTRIL) 10 MG tablet Take 1 tablet (10 mg total) by mouth daily. 07/30/22   Varney Biles, MD  megestrol (MEGACE) 20 MG tablet Take 1 tablet (20 mg total) by mouth daily. 01/08/21   Shelly Bombard, MD  VIIBRYD 40 MG TABS Take 1 tablet by mouth daily. 05/03/15   [provider]      Allergies    Patient has no known allergies.    Review of Systems   Review of Systems  All other systems reviewed and are negative.   Physical Exam Updated Vital Signs BP (!) 162/100 (BP Location: Left Arm)   Pulse 82   Temp 98.4 F (36.9 C) (Oral)   Resp 18   Ht '4\' 11"'$  (1.499 m)   Wt 84.8 kg   SpO2 99%   BMI 37.77 kg/m  Physical Exam Vitals and nursing note reviewed.  Constitutional:      Appearance: She is well-developed.  HENT:     Head: Atraumatic.  Cardiovascular:     Rate and Rhythm: Normal rate.  Pulmonary:     Effort: Pulmonary effort is normal.  Musculoskeletal:     Cervical back: Normal range of  motion and neck supple.  Skin:    General: Skin is warm and dry.  Neurological:     Mental Status: She is alert and oriented to person, place, and time.     ED Results / Procedures / Treatments   Labs (all labs ordered are listed, but only abnormal results are displayed) Labs Reviewed  I-STAT CHEM 8, ED - Abnormal; Notable for the following components:      Result Value   Creatinine, Ser 1.30 (*)    All other components within normal limits    EKG EKG Interpretation  Date/Time:  Thursday July 30 2022 10:07:34 EDT Ventricular Rate:  76 PR Interval:  140 QRS Duration: 87 QT Interval:  363 QTC Calculation: 409 R Axis:   61 Text Interpretation: Sinus rhythm No acute changes No significant change since last tracing No old tracing to compare Confirmed by Varney Biles [phone]) on 07/30/2022 10:31:33 AM  Radiology No results found.  Procedures Procedures    Medications Ordered in ED Medications - No data to display  ED Course/ Medical Decision Making/ A&P  Medical Decision Making Risk Prescription drug management.   51 year old female comes in with chief complaint of elevated blood pressure. She is complaining of some generalized weakness, she has no active chest pain, headaches, shortness of breath, focal neurologic deficits.  BP is slightly high now.  I reviewed patient's previous PCP visits and medications.  She used to be on 10 mg lisinopril.  We will get i-STAT Chem-8 to ensure her creatinine is overall reassuring.  EKG ordered to make sure there is no ACS. Differential diagnosis limited to ensuring there is no organ dysfunction from hypertension that would prevent [place] from starting lisinopril and to make sure there is no atypical ACS.  She used to be on lisinopril 10 mg -so if her labs are reassuring we will start her on it.  She will call her PCP.  Final Clinical Impression(s) / ED Diagnoses Final diagnoses:  Hypertensive heart  disease without heart failure    Rx / DC Orders ED Discharge Orders          Ordered    lisinopril (ZESTRIL) 10 MG tablet  Daily        07/30/22 1050              Varney Biles, MD 07/30/22 1053

## 2022-08-11 ENCOUNTER — Ambulatory Visit: Payer: Medicaid Other

## 2022-09-01 ENCOUNTER — Ambulatory Visit
Admission: RE | Admit: 2022-09-01 | Discharge: 2022-09-01 | Disposition: A | Discharge status: Home / Self Care | Payer: Medicaid Other | Source: Ambulatory Visit | Attending: Advanced Practice Midwife | Admitting: Advanced Practice Midwife

## 2022-09-01 DIAGNOSIS — Z1231 Encounter for screening mammogram for malignant neoplasm of breast: Secondary | ICD-10-CM

## 2023-03-08 ENCOUNTER — Emergency Department (HOSPITAL_COMMUNITY)
Admission: EM | Admit: 2023-03-08 | Discharge: 2023-03-08 | Disposition: A | Discharge status: Home / Self Care | Payer: Medicaid Other | Attending: Student | Admitting: Student

## 2023-03-08 ENCOUNTER — Emergency Department (HOSPITAL_COMMUNITY): Payer: Medicaid Other

## 2023-03-08 ENCOUNTER — Encounter (HOSPITAL_COMMUNITY): Payer: Self-pay

## 2023-03-08 DIAGNOSIS — R944 Abnormal results of kidney function studies: Secondary | ICD-10-CM | POA: Insufficient documentation

## 2023-03-08 DIAGNOSIS — N8500 Endometrial hyperplasia, unspecified: Secondary | ICD-10-CM | POA: Diagnosis not present

## 2023-03-08 DIAGNOSIS — I10 Essential (primary) hypertension: Secondary | ICD-10-CM | POA: Diagnosis not present

## 2023-03-08 DIAGNOSIS — Z87891 Personal history of nicotine dependence: Secondary | ICD-10-CM | POA: Diagnosis not present

## 2023-03-08 DIAGNOSIS — D72829 Elevated white blood cell count, unspecified: Secondary | ICD-10-CM | POA: Insufficient documentation

## 2023-03-08 DIAGNOSIS — Z79899 Other long term (current) drug therapy: Secondary | ICD-10-CM | POA: Diagnosis not present

## 2023-03-08 DIAGNOSIS — N939 Abnormal uterine and vaginal bleeding, unspecified: Secondary | ICD-10-CM | POA: Diagnosis present

## 2023-03-08 LAB — COMPREHENSIVE METABOLIC PANEL
ALT: 8 U/L (ref 0–44)
AST: 13 U/L — ABNORMAL LOW (ref 15–41)
Albumin: 3.9 g/dL (ref 3.5–5.0)
Alkaline Phosphatase: 89 U/L (ref 38–126)
Anion gap: 6 (ref 5–15)
BUN: 14 mg/dL (ref 6–20)
CO2: 26 mmol/L (ref 22–32)
Calcium: 8.9 mg/dL (ref 8.9–10.3)
Chloride: 105 mmol/L (ref 98–111)
Creatinine, Ser: 1.28 mg/dL — ABNORMAL HIGH (ref 0.44–1.00)
GFR, Estimated: 51 mL/min — ABNORMAL LOW (ref >60)
Glucose, Bld: 97 mg/dL (ref 70–99)
Potassium: 3.7 mmol/L (ref 3.5–5.1)
Sodium: 137 mmol/L (ref 135–145)
Total Bilirubin: 0.6 mg/dL (ref 0.3–1.2)
Total Protein: 8.4 g/dL — ABNORMAL HIGH (ref 6.5–8.1)

## 2023-03-08 LAB — CBC WITH DIFFERENTIAL/PLATELET
Abs Immature Granulocytes: 0.04 10*3/uL (ref 0.00–0.07)
Basophils Absolute: 0.1 10*3/uL (ref 0.0–0.1)
Basophils Relative: 0 %
Eosinophils Absolute: 0.2 10*3/uL (ref 0.0–0.5)
Eosinophils Relative: 1 %
HCT: 39.8 % (ref 36.0–46.0)
Hemoglobin: 12.5 g/dL (ref 12.0–15.0)
Immature Granulocytes: 0 %
Lymphocytes Relative: 27 %
Lymphs Abs: 3.1 10*3/uL (ref 0.7–4.0)
MCH: 28 pg (ref 26.0–34.0)
MCHC: 31.4 g/dL (ref 30.0–36.0)
MCV: 89 fL (ref 80.0–100.0)
Monocytes Absolute: 0.8 10*3/uL (ref 0.1–1.0)
Monocytes Relative: 7 %
Neutro Abs: 7.5 10*3/uL (ref 1.7–7.7)
Neutrophils Relative %: 65 %
Platelets: 263 10*3/uL (ref 150–400)
RBC: 4.47 MIL/uL (ref 3.87–5.11)
RDW: 13.1 % (ref 11.5–15.5)
WBC: 11.6 10*3/uL — ABNORMAL HIGH (ref 4.0–10.5)
nRBC: 0 % (ref 0.0–0.2)

## 2023-03-08 LAB — I-STAT BETA HCG BLOOD, ED (MC, WL, AP ONLY): I-stat hCG, quantitative: <5 m[IU]/mL (ref <5)

## 2023-03-08 LAB — PROTIME-INR
INR: 0.9 (ref 0.8–1.2)
Prothrombin Time: 12 seconds (ref 11.4–15.2)

## 2023-03-08 NOTE — ED Triage Notes (Signed)
Pt reports that she began to have vaginal bleeding today and with lower abd pain and has not had a period in years.

## 2023-03-08 NOTE — ED Notes (Signed)
Pt A&OX4 ambulatory at d/c with independent steady gait. Pt verbalized understanding of d/c instructions and follow up care. 
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
## 2023-03-15 ENCOUNTER — Ambulatory Visit: Payer: Medicaid Other | Admitting: Advanced Practice Midwife

## 2023-03-18 ENCOUNTER — Ambulatory Visit: Payer: Medicaid Other | Admitting: Obstetrics and Gynecology

## 2023-07-13 ENCOUNTER — Other Ambulatory Visit: Payer: Self-pay

## 2023-07-13 ENCOUNTER — Emergency Department (HOSPITAL_COMMUNITY)
Admission: EM | Admit: 2023-07-13 | Discharge: 2023-07-13 | Disposition: A | Discharge status: Home / Self Care | Payer: MEDICAID | Attending: Emergency Medicine | Admitting: Emergency Medicine

## 2023-07-13 ENCOUNTER — Emergency Department (HOSPITAL_COMMUNITY): Payer: MEDICAID

## 2023-07-13 ENCOUNTER — Other Ambulatory Visit (HOSPITAL_COMMUNITY): Payer: MEDICAID

## 2023-07-13 DIAGNOSIS — Z79899 Other long term (current) drug therapy: Secondary | ICD-10-CM | POA: Diagnosis not present

## 2023-07-13 DIAGNOSIS — H4710 Unspecified papilledema: Secondary | ICD-10-CM

## 2023-07-13 DIAGNOSIS — R930 Abnormal findings on diagnostic imaging of skull and head, not elsewhere classified: Secondary | ICD-10-CM | POA: Insufficient documentation

## 2023-07-13 DIAGNOSIS — H5712 Ocular pain, left eye: Secondary | ICD-10-CM | POA: Diagnosis present

## 2023-07-13 LAB — BASIC METABOLIC PANEL
Anion gap: 8 (ref 5–15)
BUN: 25 mg/dL — ABNORMAL HIGH (ref 6–20)
CO2: 22 mmol/L (ref 22–32)
Calcium: 8.9 mg/dL (ref 8.9–10.3)
Chloride: 107 mmol/L (ref 98–111)
Creatinine, Ser: 1.3 mg/dL — ABNORMAL HIGH (ref 0.44–1.00)
GFR, Estimated: 50 mL/min — ABNORMAL LOW (ref >60)
Glucose, Bld: 87 mg/dL (ref 70–99)
Potassium: 3.6 mmol/L (ref 3.5–5.1)
Sodium: 137 mmol/L (ref 135–145)

## 2023-07-13 LAB — CBC WITH DIFFERENTIAL/PLATELET
Abs Immature Granulocytes: 0.12 10*3/uL — ABNORMAL HIGH (ref 0.00–0.07)
Basophils Absolute: 0 10*3/uL (ref 0.0–0.1)
Basophils Relative: 0 %
Eosinophils Absolute: 0 10*3/uL (ref 0.0–0.5)
Eosinophils Relative: 0 %
HCT: 39.8 % (ref 36.0–46.0)
Hemoglobin: 12.3 g/dL (ref 12.0–15.0)
Immature Granulocytes: 1 %
Lymphocytes Relative: 19 %
Lymphs Abs: 2.8 10*3/uL (ref 0.7–4.0)
MCH: 27.5 pg (ref 26.0–34.0)
MCHC: 30.9 g/dL (ref 30.0–36.0)
MCV: 89 fL (ref 80.0–100.0)
Monocytes Absolute: 1 10*3/uL (ref 0.1–1.0)
Monocytes Relative: 7 %
Neutro Abs: 10.8 10*3/uL — ABNORMAL HIGH (ref 1.7–7.7)
Neutrophils Relative %: 73 %
Platelets: 292 10*3/uL (ref 150–400)
RBC: 4.47 MIL/uL (ref 3.87–5.11)
RDW: 13.2 % (ref 11.5–15.5)
WBC: 14.6 10*3/uL — ABNORMAL HIGH (ref 4.0–10.5)
nRBC: 0 % (ref 0.0–0.2)

## 2023-07-13 LAB — HCG, SERUM, QUALITATIVE: Preg, Serum: NEGATIVE

## 2023-07-13 MED ORDER — GADOBUTROL 1 MMOL/ML IV SOLN
8.5000 mL | Freq: Once | INTRAVENOUS | Status: AC | PRN
  Administered 2023-07-13: 8.5 mL via INTRAVENOUS

## 2023-07-13 MED ORDER — IOHEXOL 350 MG/ML SOLN
80.0000 mL | Freq: Once | INTRAVENOUS | Status: AC | PRN
  Administered 2023-07-13: 80 mL via INTRAVENOUS

## 2023-07-13 MED ORDER — HYDRALAZINE HCL 20 MG/ML IJ SOLN
10.0000 mg | Freq: Once | INTRAMUSCULAR | Status: AC
  Administered 2023-07-13: 10 mg via INTRAVENOUS
  Filled 2023-07-13: qty 1

## 2023-07-13 MED ORDER — SODIUM CHLORIDE (PF) 0.9 % IJ SOLN
INTRAMUSCULAR | Status: AC
  Filled 2023-07-13: qty 50

## 2023-07-13 MED ORDER — SODIUM CHLORIDE 0.9 % IV BOLUS
1000.0000 mL | Freq: Once | INTRAVENOUS | Status: AC
  Administered 2023-07-13: 1000 mL via INTRAVENOUS
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
## 2023-07-13 NOTE — Discharge Instructions (Signed)
Please follow-up with the eye doctor and the neurologist.  We had discussed doing a lumbar puncture here.  That would be the way to formally diagnose if you had something called idiopathic intracranial hypertension.  This can be done as an outpatient.  Please discuss it with the neurologist and your family doctor they can schedule you to have this done probably through the interventional radiology suite if you so choose.  Please return for worsening headache or vision changes.
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
## 2023-07-22 ENCOUNTER — Encounter: Payer: Self-pay | Admitting: Neurology

## 2023-07-22 ENCOUNTER — Ambulatory Visit: Payer: MEDICAID | Admitting: Neurology

## 2024-06-19 ENCOUNTER — Ambulatory Visit: Payer: MEDICAID

## 2024-06-19 ENCOUNTER — Other Ambulatory Visit (HOSPITAL_COMMUNITY)
Admission: RE | Admit: 2024-06-19 | Discharge: 2024-06-19 | Disposition: A | Discharge status: Home / Self Care | Payer: MEDICAID | Source: Ambulatory Visit | Attending: Obstetrics and Gynecology | Admitting: Obstetrics and Gynecology

## 2024-06-19 VITALS — BP 159/111 | HR 85

## 2024-06-19 DIAGNOSIS — Z113 Encounter for screening for infections with a predominantly sexual mode of transmission: Secondary | ICD-10-CM | POA: Insufficient documentation

## 2024-06-19 NOTE — Progress Notes (Signed)
 SUBJECTIVE:  53 y.o. female who desires a STI screen. Denies abnormal vaginal discharge, bleeding or significant pelvic pain. No UTI symptoms. Denies history of known exposure to STD.  No LMP recorded. Patient is perimenopausal.  OBJECTIVE:  She appears well. Pt BP x2 elevated at time of visit. Pt is on BP meds; however, is trying to control BP with natural alternatives. RN encouraged pt to continue taking BP meds, as well as monitoring BP at home. Pt denies any headaches, blurred vision, or dizziness at time of visit.   ASSESSMENT:  STI Screen   PLAN:  Pt offered STI blood screening-requested GC, chlamydia, and trichomonas probe sent to lab.  Treatment: To be determined once lab results are received.  Pt follow up as needed.

## 2024-06-20 ENCOUNTER — Ambulatory Visit: Payer: Self-pay | Admitting: Obstetrics and Gynecology

## 2024-06-20 LAB — CERVICOVAGINAL ANCILLARY ONLY
Bacterial Vaginitis (gardnerella): NEGATIVE
Chlamydia: NEGATIVE
Comment: NEGATIVE
Comment: NEGATIVE
Comment: NEGATIVE
Comment: NORMAL
Neisseria Gonorrhea: NEGATIVE
Trichomonas: NEGATIVE

## 2024-06-20 LAB — HEPATITIS C ANTIBODY: Hep C Virus Ab: NONREACTIVE

## 2024-06-20 LAB — HIV ANTIBODY (ROUTINE TESTING W REFLEX): HIV Screen 4th Generation wRfx: NONREACTIVE

## 2024-06-20 LAB — HEPATITIS B SURFACE ANTIGEN: Hepatitis B Surface Ag: NEGATIVE

## 2024-06-20 LAB — RPR: RPR Ser Ql: NONREACTIVE
